# Patient Record
Sex: Female | Born: 1935 | Race: Black or African American | Hispanic: No | Marital: Single | Smoking: Never smoker
Health system: Southern US, Community
[De-identification: ages and names within clinical notes are randomized; demographics above are authoritative.]

## PROBLEM LIST (undated history)

## (undated) DIAGNOSIS — H547 Unspecified visual loss: Secondary | ICD-10-CM

## (undated) DIAGNOSIS — I10 Essential (primary) hypertension: Secondary | ICD-10-CM

## (undated) DIAGNOSIS — E785 Hyperlipidemia, unspecified: Secondary | ICD-10-CM

## (undated) DIAGNOSIS — N289 Disorder of kidney and ureter, unspecified: Secondary | ICD-10-CM

## (undated) DIAGNOSIS — IMO0002 Reserved for concepts with insufficient information to code with codable children: Secondary | ICD-10-CM

## (undated) HISTORY — DX: Hyperlipidemia, unspecified: E78.5

---
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
## 2011-11-11 ENCOUNTER — Observation Stay (HOSPITAL_COMMUNITY)
Admission: EM | Admit: 2011-11-11 | Discharge: 2011-11-12 | Disposition: A | Discharge status: Home / Self Care | Payer: Medicare Other | Attending: Internal Medicine | Admitting: Internal Medicine

## 2011-11-11 ENCOUNTER — Inpatient Hospital Stay (HOSPITAL_COMMUNITY): Payer: Medicare Other

## 2011-11-11 ENCOUNTER — Emergency Department (HOSPITAL_COMMUNITY): Payer: Medicare Other

## 2011-11-11 ENCOUNTER — Encounter (HOSPITAL_COMMUNITY): Payer: Self-pay | Admitting: *Deleted

## 2011-11-11 DIAGNOSIS — R55 Syncope and collapse: Principal | ICD-10-CM | POA: Insufficient documentation

## 2011-11-11 DIAGNOSIS — R609 Edema, unspecified: Secondary | ICD-10-CM | POA: Insufficient documentation

## 2011-11-11 DIAGNOSIS — R61 Generalized hyperhidrosis: Secondary | ICD-10-CM | POA: Insufficient documentation

## 2011-11-11 DIAGNOSIS — R404 Transient alteration of awareness: Secondary | ICD-10-CM

## 2011-11-11 DIAGNOSIS — R0989 Other specified symptoms and signs involving the circulatory and respiratory systems: Secondary | ICD-10-CM | POA: Insufficient documentation

## 2011-11-11 DIAGNOSIS — M79609 Pain in unspecified limb: Secondary | ICD-10-CM

## 2011-11-11 DIAGNOSIS — R4020 Unspecified coma: Secondary | ICD-10-CM

## 2011-11-11 DIAGNOSIS — R0609 Other forms of dyspnea: Secondary | ICD-10-CM | POA: Insufficient documentation

## 2011-11-11 DIAGNOSIS — I519 Heart disease, unspecified: Secondary | ICD-10-CM | POA: Insufficient documentation

## 2011-11-11 DIAGNOSIS — E119 Type 2 diabetes mellitus without complications: Secondary | ICD-10-CM | POA: Insufficient documentation

## 2011-11-11 DIAGNOSIS — E139 Other specified diabetes mellitus without complications: Secondary | ICD-10-CM

## 2011-11-11 DIAGNOSIS — H547 Unspecified visual loss: Secondary | ICD-10-CM | POA: Insufficient documentation

## 2011-11-11 DIAGNOSIS — M7989 Other specified soft tissue disorders: Secondary | ICD-10-CM

## 2011-11-11 DIAGNOSIS — I10 Essential (primary) hypertension: Secondary | ICD-10-CM | POA: Insufficient documentation

## 2011-11-11 HISTORY — DX: Disorder of kidney and ureter, unspecified: N28.9

## 2011-11-11 HISTORY — DX: Essential (primary) hypertension: I10

## 2011-11-11 HISTORY — DX: Unspecified visual loss: H54.7

## 2011-11-11 HISTORY — DX: Reserved for concepts with insufficient information to code with codable children: IMO0002

## 2011-11-11 LAB — CARDIAC PANEL(CRET KIN+CKTOT+MB+TROPI)
CK, MB: 2.4 ng/mL (ref 0.3–4.0)
Troponin I: <0.3 ng/mL (ref <0.30)

## 2011-11-11 LAB — CBC WITH DIFFERENTIAL/PLATELET
Basophils Absolute: 0 10*3/uL (ref 0.0–0.1)
Basophils Relative: 0 % (ref 0–1)
Eosinophils Relative: 1 % (ref 0–5)
HCT: 36.8 % (ref 36.0–46.0)
Lymphocytes Relative: 24 % (ref 12–46)
MCHC: 32.9 g/dL (ref 30.0–36.0)
MCV: 93.4 fL (ref 78.0–100.0)
Monocytes Absolute: 0.4 10*3/uL (ref 0.1–1.0)
Neutro Abs: 5.4 10*3/uL (ref 1.7–7.7)
Platelets: 233 10*3/uL (ref 150–400)
RDW: 14 % (ref 11.5–15.5)
WBC: 7.8 10*3/uL (ref 4.0–10.5)

## 2011-11-11 LAB — GLUCOSE, CAPILLARY

## 2011-11-11 LAB — POCT I-STAT TROPONIN I

## 2011-11-11 LAB — CBC
Hemoglobin: 11.3 g/dL — ABNORMAL LOW (ref 12.0–15.0)
MCH: 31.4 pg (ref 26.0–34.0)
MCHC: 33.4 g/dL (ref 30.0–36.0)
Platelets: 218 10*3/uL (ref 150–400)
RBC: 3.6 MIL/uL — ABNORMAL LOW (ref 3.87–5.11)

## 2011-11-11 LAB — URINALYSIS, ROUTINE W REFLEX MICROSCOPIC
Bilirubin Urine: NEGATIVE
Leukocytes, UA: NEGATIVE
Nitrite: NEGATIVE
Specific Gravity, Urine: 1.014 (ref 1.005–1.030)
Urobilinogen, UA: 1 mg/dL (ref 0.0–1.0)
pH: 7 (ref 5.0–8.0)

## 2011-11-11 LAB — BASIC METABOLIC PANEL
CO2: 24 mEq/L (ref 19–32)
Calcium: 8.9 mg/dL (ref 8.4–10.5)
Creatinine, Ser: 1.02 mg/dL (ref 0.50–1.10)
GFR calc Af Amer: 54 mL/min — ABNORMAL LOW (ref >90)
Sodium: 139 mEq/L (ref 135–145)

## 2011-11-11 LAB — MAGNESIUM: Magnesium: 2.1 mg/dL (ref 1.5–2.5)

## 2011-11-11 LAB — D-DIMER, QUANTITATIVE: D-Dimer, Quant: 1.26 ug/mL-FEU — ABNORMAL HIGH (ref 0.00–0.48)

## 2011-11-11 LAB — PRO B NATRIURETIC PEPTIDE
Pro B Natriuretic peptide (BNP): 136.9 pg/mL (ref 0–450)
Pro B Natriuretic peptide (BNP): 137.2 pg/mL (ref 0–450)

## 2011-11-11 MED ORDER — LEVALBUTEROL HCL 0.63 MG/3ML IN NEBU
0.6300 mg | INHALATION_SOLUTION | Freq: Four times a day (QID) | RESPIRATORY_TRACT | Status: DC
  Filled 2011-11-11 (×3): qty 3

## 2011-11-11 MED ORDER — SODIUM CHLORIDE 0.9 % IJ SOLN
3.0000 mL | Freq: Two times a day (BID) | INTRAMUSCULAR | Status: DC
  Administered 2011-11-12: 3 mL via INTRAVENOUS

## 2011-11-11 MED ORDER — LOSARTAN POTASSIUM 50 MG PO TABS
100.0000 mg | ORAL_TABLET | Freq: Every day | ORAL | Status: DC
  Administered 2011-11-12: 100 mg via ORAL
  Filled 2011-11-11: qty 2

## 2011-11-11 MED ORDER — HYDROCHLOROTHIAZIDE 25 MG PO TABS
25.0000 mg | ORAL_TABLET | Freq: Every day | ORAL | Status: DC
  Administered 2011-11-12: 25 mg via ORAL
  Filled 2011-11-11: qty 1

## 2011-11-11 MED ORDER — SODIUM CHLORIDE 0.9 % IJ SOLN
3.0000 mL | Freq: Two times a day (BID) | INTRAMUSCULAR | Status: DC
  Administered 2011-11-11: 3 mL via INTRAVENOUS

## 2011-11-11 MED ORDER — INSULIN ASPART 100 UNIT/ML ~~LOC~~ SOLN
0.0000 [IU] | Freq: Three times a day (TID) | SUBCUTANEOUS | Status: DC
  Administered 2011-11-12: 3 [IU] via SUBCUTANEOUS
  Administered 2011-11-12: 2 [IU] via SUBCUTANEOUS
  Administered 2011-11-12: 3 [IU] via SUBCUTANEOUS

## 2011-11-11 MED ORDER — AMLODIPINE BESYLATE 5 MG PO TABS
5.0000 mg | ORAL_TABLET | Freq: Every day | ORAL | Status: DC
  Administered 2011-11-12: 5 mg via ORAL
  Filled 2011-11-11: qty 1

## 2011-11-11 MED ORDER — LOSARTAN POTASSIUM-HCTZ 100-25 MG PO TABS: 1.0000 | ORAL_TABLET | Freq: Every day | ORAL | Status: DC

## 2011-11-11 MED ORDER — ENOXAPARIN SODIUM 40 MG/0.4ML ~~LOC~~ SOLN
40.0000 mg | SUBCUTANEOUS | Status: DC
  Administered 2011-11-11: 40 mg via SUBCUTANEOUS
  Filled 2011-11-11: qty 0.4

## 2011-11-11 MED ORDER — ASPIRIN EC 325 MG PO TBEC
325.0000 mg | DELAYED_RELEASE_TABLET | Freq: Every day | ORAL | Status: DC
  Administered 2011-11-12: 325 mg via ORAL
  Filled 2011-11-11: qty 1

## 2011-11-11 MED ORDER — METOPROLOL SUCCINATE ER 25 MG PO TB24
25.0000 mg | ORAL_TABLET | Freq: Every day | ORAL | Status: DC
  Administered 2011-11-12: 25 mg via ORAL
  Filled 2011-11-11: qty 1

## 2011-11-11 MED ORDER — DOCUSATE SODIUM 100 MG PO CAPS
100.0000 mg | ORAL_CAPSULE | Freq: Two times a day (BID) | ORAL | Status: DC
  Administered 2011-11-12: 100 mg via ORAL
  Filled 2011-11-11 (×2): qty 1

## 2011-11-11 MED ORDER — ONDANSETRON HCL 4 MG/2ML IJ SOLN: 4.0000 mg | Freq: Four times a day (QID) | INTRAMUSCULAR | Status: DC | PRN

## 2011-11-11 MED ORDER — SODIUM CHLORIDE 0.9 % IJ SOLN: 3.0000 mL | INTRAMUSCULAR | Status: DC | PRN

## 2011-11-11 MED ORDER — SIMVASTATIN 20 MG PO TABS
20.0000 mg | ORAL_TABLET | Freq: Every evening | ORAL | Status: DC
  Administered 2011-11-11 – 2011-11-12 (×2): 20 mg via ORAL
  Filled 2011-11-11 (×2): qty 1

## 2011-11-11 MED ORDER — ONDANSETRON HCL 4 MG/2ML IJ SOLN: 4.0000 mg | Freq: Three times a day (TID) | INTRAMUSCULAR | Status: DC | PRN

## 2011-11-11 MED ORDER — LEVALBUTEROL HCL 0.63 MG/3ML IN NEBU
0.6300 mg | INHALATION_SOLUTION | Freq: Four times a day (QID) | RESPIRATORY_TRACT | Status: DC | PRN
  Filled 2011-11-11: qty 3

## 2011-11-11 MED ORDER — ONDANSETRON HCL 4 MG PO TABS: 4.0000 mg | ORAL_TABLET | Freq: Four times a day (QID) | ORAL | Status: DC | PRN

## 2011-11-11 MED ORDER — ACETAMINOPHEN 325 MG PO TABS: 650.0000 mg | ORAL_TABLET | Freq: Four times a day (QID) | ORAL | Status: DC | PRN

## 2011-11-11 MED ORDER — ACETAMINOPHEN 650 MG RE SUPP: 650.0000 mg | Freq: Four times a day (QID) | RECTAL | Status: DC | PRN

## 2011-11-11 MED ORDER — SODIUM CHLORIDE 0.9 % IV SOLN: 250.0000 mL | INTRAVENOUS | Status: DC | PRN

## 2011-11-11 MED ORDER — SENNA 8.6 MG PO TABS
1.0000 | ORAL_TABLET | Freq: Two times a day (BID) | ORAL | Status: DC
  Administered 2011-11-11 – 2011-11-12 (×2): 8.6 mg via ORAL
  Filled 2011-11-11 (×3): qty 1

## 2011-11-11 NOTE — Progress Notes (Signed)
Right lower extremity venous duplex completed.  Preliminary report is negative for DVT, SVT, or a Baker's cyst in the right leg.  Negative for DVT in the left common femoral vein. 

## 2011-11-11 NOTE — ED Notes (Signed)
[facility]- pt is from assisted living facility. Pt was seen by other pts to go unresponsive. Pt states that she could hear everything but that they sounded far away. Residents say that they could not get her to respond to them for approx 10 minutes. No fall or injuries. Pt denies any chest pain/sob. Pt is jehovah's witness and does not like to have blood drawn. BP 160/90. Pt is blind.

## 2011-11-11 NOTE — H&P (Addendum)
Carol Poole MRN: [id number] DOB/AGE: 11-24-1917 78 y.o. Primary Care Physician:No primary provider on file. Admit date: 11/11/2011 Chief Complaint: Syncope  HPI: 78 year old female here today after a questionable syncopal episode while sitting outside in the sun. States that she sits in the sun 30 minutes a day because her vitamin D is low. Pt became light headed sitting in the sun and put a piece of candy in her mouth because she thought that she was hypoglycemic. Bystanders state that she was unresponsive for 5-10 minutes sitting up, she did not fall or hit her head. She did not have any slurred speech, unilateral weakness, dysphagia, facial droop. Patient states that she could hear the voices but could not respond. + diaphoresis. Denies chest pain shortness of breath fever nausea vomiting diarrhea or bleeding anywhere. Patient is blind and lives alone in an apartment. 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   Daughter states that a couple weeks ago her hypertension was out of control and the home health nurse had to come and check on her daily and her systolic was in the 200 range, she was recently started on Lasix by her primary care provider. She'll also try to loose weight and has lost about 10 pounds. Her blood pressure has since improved..  No medical records in the Epic system. Patient goes to Dr. Adria Dill family and then Dr. Jiles Garter with [facility] cardiology. We will have records faxed to our facility from Dr. Paulino Rily office now. PMH hypertension, diabetes, blind and renal insuff.   Past Medical History  Diagnosis Date  . Hypertension   . Diabetes mellitus   . Degeneration, intervertebral disc   . Blind   . Renal disorder     Past Surgical History  Procedure Date  . Abdominal hysterectomy     Prior to Admission medications   Medication Sig Start Date End Date Taking? Authorizing Provider  amLODipine (NORVASC) 5 MG tablet Take 5 mg by mouth  daily.    Historical Provider, MD  aspirin EC 81 MG tablet Take 81 mg by mouth daily.    Historical Provider, MD  celecoxib (CELEBREX) 100 MG capsule Take 100 mg by mouth 2 (two) times daily.    Historical Provider, MD  losartan-hydrochlorothiazide (HYZAAR) 100-25 MG per tablet Take 1 tablet by mouth daily.    Historical Provider, MD  metoprolol succinate (TOPROL-XL) 100 MG 24 hr tablet Take 100 mg by mouth daily. Take with or immediately following a meal.    Historical Provider, MD  polyethylene glycol (MIRALAX / GLYCOLAX) packet Take 17 g by mouth as needed. For constipation    Historical Provider, MD  simvastatin (ZOCOR) 20 MG tablet Take 20 mg by mouth every evening.    Historical Provider, MD    Allergies:  Allergies  Allergen Reactions  . Corticosteroids Other (See Comments)    Hearing loss.     Family history Negative for hypertension and diabetes   Social History:  reports that she has never smoked. She does not have any smokeless tobacco history on file. She reports that she does not drink alcohol or use illicit drugs.     ROS: Constitutional: Negative. Negative for fever and fatigue.  HENT: Negative. Negative for congestion.  Eyes: Negative.  Respiratory: Negative. Negative for cough, chest tightness, shortness of breath and wheezing.  Cardiovascular: Positive for syncope. Negative for chest pain and leg swelling.  Chronic ankle swelling  Gastrointestinal: Negative. Negative for nausea, vomiting and abdominal  pain.  Musculoskeletal: Negative for back pain and gait problem.  Skin: Negative for rash.  Neurological: Positive for light-headedness. Negative for dizziness, weakness, numbness and headaches.  Psychiatric/Behavioral: Negative. Negative for confusion.  All other systems reviewed and are negative.     PHYSICAL EXAM: Blood pressure 142/66, pulse 72, temperature 97 F (36.1 C), temperature source Oral, resp. rate 14, SpO2 94.00%. Constitutional: She is  oriented to person, place, and time. She appears well-developed and well-nourished.  HENT:  Head: Normocephalic and atraumatic.  Eyes: Conjunctivae and EOM are normal. Pupils are equal, round, and reactive to light.  Neck: Normal range of motion. Neck supple.  Cardiovascular: Normal rate and normal heart sounds.  No murmur heard.  Pulmonary/Chest: Effort normal and breath sounds normal. No respiratory distress. She has no wheezes. She exhibits no tenderness.  Abdominal: Soft. Bowel sounds are normal. She exhibits no distension. There is no tenderness.  Musculoskeletal: Normal range of motion. She exhibits edema. She exhibits no tenderness.  Chronic Bilateral ankle edema no edema to her lower legs  Neurological: She is alert and oriented to person, place, and time. She has normal reflexes. No cranial nerve deficit. Coordination normal.  Skin: Skin is warm and dry.  Psychiatric: She has a normal mood and affect.    No results found for this or any previous visit (from the past 240 hour(s)).   Results for orders placed during the hospital encounter of 11/11/11 (from the past 48 hour(s))  GLUCOSE, CAPILLARY     Status: Abnormal   Collection Time   11/11/11  1:09 PM      Component Value Range Comment   Glucose-Capillary 193 (*) 70 - 99 mg/dL    Comment 1 Documented in Chart      Comment 2 Notify RN     POCT I-STAT TROPONIN I     Status: Normal   Collection Time   11/11/11  3:59 PM      Component Value Range Comment   Troponin i, poc 0.01  0.00 - 0.08 ng/mL    Comment 3            URINALYSIS, ROUTINE W REFLEX MICROSCOPIC     Status: Normal   Collection Time   11/11/11  4:40 PM      Component Value Range Comment   Color, Urine YELLOW  YELLOW    APPearance CLEAR  CLEAR    Specific Gravity, Urine 1.014  1.005 - 1.030    pH 7.0  5.0 - 8.0    Glucose, UA NEGATIVE  NEGATIVE mg/dL    Hgb urine dipstick NEGATIVE  NEGATIVE    Bilirubin Urine NEGATIVE  NEGATIVE    Ketones, ur NEGATIVE   NEGATIVE mg/dL    Protein, ur NEGATIVE  NEGATIVE mg/dL    Urobilinogen, UA 1.0  0.0 - 1.0 mg/dL    Nitrite NEGATIVE  NEGATIVE    Leukocytes, UA NEGATIVE  NEGATIVE MICROSCOPIC NOT DONE ON URINES WITH NEGATIVE PROTEIN, BLOOD, LEUKOCYTES, NITRITE, OR GLUCOSE <1000 mg/dL.  CBC WITH DIFFERENTIAL     Status: Normal   Collection Time   11/11/11  5:29 PM      Component Value Range Comment   WBC 7.8  4.0 - 10.5 K/uL    RBC 3.94  3.87 - 5.11 MIL/uL    Hemoglobin 12.1  12.0 - 15.0 g/dL    HCT 40.9  81.1 - 91.4 %    MCV 93.4  78.0 - 100.0 fL    MCH 30.7  26.0 - 34.0 pg    MCHC 32.9  30.0 - 36.0 g/dL    RDW 16.1  09.6 - 04.5 %    Platelets 233  150 - 400 K/uL    Neutrophils Relative 70  43 - 77 %    Neutro Abs 5.4  1.7 - 7.7 K/uL    Lymphocytes Relative 24  12 - 46 %    Lymphs Abs 1.8  0.7 - 4.0 K/uL    Monocytes Relative 5  3 - 12 %    Monocytes Absolute 0.4  0.1 - 1.0 K/uL    Eosinophils Relative 1  0 - 5 %    Eosinophils Absolute 0.1  0.0 - 0.7 K/uL    Basophils Relative 0  0 - 1 %    Basophils Absolute 0.0  0.0 - 0.1 K/uL   BASIC METABOLIC PANEL     Status: Abnormal   Collection Time   11/11/11  5:29 PM      Component Value Range Comment   Sodium 139  135 - 145 mEq/L    Potassium 4.1  3.5 - 5.1 mEq/L    Chloride 103  96 - 112 mEq/L    CO2 24  19 - 32 mEq/L    Glucose, Bld 162 (*) 70 - 99 mg/dL    BUN 24 (*) 6 - 23 mg/dL    Creatinine, Ser 4.09  0.50 - 1.10 mg/dL    Calcium 8.9  8.4 - 81.1 mg/dL    GFR calc non Af Amer 47 (*) >90 mL/min    GFR calc Af Amer 54 (*) >90 mL/min   PRO B NATRIURETIC PEPTIDE     Status: Normal   Collection Time   11/11/11  5:30 PM      Component Value Range Comment   Pro B Natriuretic peptide (BNP) 136.9  0 - 450 pg/mL     Dg Chest Port 1 View  11/11/2011  *RADIOLOGY REPORT*  Clinical Data: Syncope.  PORTABLE CHEST - 1 VIEW  Comparison: None.  Findings: Single view of the chest demonstrates mild enlargement of the cardiac silhouette.  The thoracic aorta  is heavily calcified. There are increased densities in superior mediastinum which could be vascular or thyroid tissue.  Mild enlargement the right hilar tissue is probably vascular in etiology.  Lungs are clear without airspace disease or edema.  Degenerative changes in the right AC joint and right shoulder.  IMPRESSION: Prominent soft tissue densities in the superior mediastinum.  This finding could be related to vascular structures or enlarged thyroid tissue.  This could be further evaluated with a chest CT with IV contrast.  Mild cardiomegaly.  Original Report Authenticated By: Richarda Overlie, M.D.    Impression:  Active Problems:  Syncope  Diabetes 1.5, managed as type 1  Hypertension peripheral edema     Plan: #1 syncope patient does have a right bundle branch block and left anterior fascicular block on her EKG, somewhat changed from the last EKG, maintained on telemetry, cycle cardiac enzymes, 2-D echo, CT of the head, will also do a carotid Doppler #2 diabetes start the patient on sliding scale insulin, check hemoglobin A1c #3 hypertension continue the patient on her antihypertensive medications, decrease the dose of metoprolol given mild bradycardia and bundle branch block on the EKG She was full code Time spent 70 minutes Daughter has been counseled about plan of care by the bedside    [facility] 11/11/2011, 7:40 PM

## 2011-11-11 NOTE — ED Provider Notes (Signed)
History     CSN: [id number]  Arrival date & time 11/11/11  1259   First MD Initiated Contact with Patient 11/11/11 1504      Chief Complaint  Patient presents with  . Loss of Consciousness    (Consider location/radiation/quality/duration/timing/severity/associated sxs/prior treatment) Patient is a 78 y.o. female presenting with syncope. The history is provided by the patient. No language interpreter was used.  Loss of Consciousness This is a new problem. The current episode started today. The problem occurs rarely. The problem has been resolved. Pertinent negatives include no abdominal pain, chest pain, congestion, coughing, fatigue, fever, headaches, nausea, numbness, rash, vomiting or weakness. Nothing aggravates the symptoms.     78 year old female here today after a questionable syncopal episode while sitting outside in the sun. States that she sits in the sun 30 minutes a day because her vitamin D is low. Pt became light headed sitting in the sun and put a piece of candy in her mouth.  Bystanders state that she was unresponsive for 5-10 minutes sitting up, she did not fall or hit her head.   Patient states that she could hear the voices but could not respond. + diaphoresis.   Denies chest pain shortness of breath fever nausea vomiting diarrhea or bleeding anywhere. Patient is blind and lives alone in an apartment. 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 Daughter states that a couple weeks ago her hypertension was out of control and the home health nurse had to come and check on her daily. Patient is a TEFL teacher witness and would not like to receive blood in the hospital. No medical records in the Epic system. Patient goes to Dr. Hazle Nordmann family practice and then Dr. Jocelyn Lamer with [facility] cardiology. We will have records faxed to our facility from Dr. Paulino Rily office now.  PMH hypertension, diabetes, blind and renal insuff.   Past Medical History  Diagnosis  Date  . Hypertension   . Diabetes mellitus   . Degeneration, intervertebral disc   . Blind   . Renal disorder     Past Surgical History  Procedure Date  . Abdominal hysterectomy     No family history on file.  History  Substance Use Topics  . Smoking status: Never Smoker   . Smokeless tobacco: Not on file  . Alcohol Use: No    OB History    Grav Para Term Preterm Abortions TAB SAB Ect Mult Living                  Review of Systems  Constitutional: Negative.  Negative for fever and fatigue.  HENT: Negative.  Negative for congestion.   Eyes: Negative.   Respiratory: Negative.  Negative for cough, chest tightness, shortness of breath and wheezing.   Cardiovascular: Positive for syncope. Negative for chest pain and leg swelling.       Chronic ankle swelling  Gastrointestinal: Negative.  Negative for nausea, vomiting and abdominal pain.  Musculoskeletal: Negative for back pain and gait problem.  Skin: Negative for rash.  Neurological: Positive for light-headedness. Negative for dizziness, weakness, numbness and headaches.  Psychiatric/Behavioral: Negative.  Negative for confusion.  All other systems reviewed and are negative.    Allergies  Corticosteroids  Home Medications  No current outpatient prescriptions on file.  BP 161/58  Pulse 62  Temp 97 F (36.1 C) (Oral)  Resp 15  SpO2 100%  Physical Exam  Nursing note and vitals reviewed. Constitutional: She is oriented  to person, place, and time. She appears well-developed and well-nourished.  HENT:  Head: Normocephalic and atraumatic.  Eyes: Conjunctivae and EOM are normal. Pupils are equal, round, and reactive to light.  Neck: Normal range of motion. Neck supple.  Cardiovascular: Normal rate and normal heart sounds.   No murmur heard. Pulmonary/Chest: Effort normal and breath sounds normal. No respiratory distress. She has no wheezes. She exhibits no tenderness.  Abdominal: Soft. Bowel sounds are normal.  She exhibits no distension. There is no tenderness.  Musculoskeletal: Normal range of motion. She exhibits edema. She exhibits no tenderness.       Chronic Bilateral ankle edema no edema to her lower legs  Neurological: She is alert and oriented to person, place, and time. She has normal reflexes. No cranial nerve deficit. Coordination normal.  Skin: Skin is warm and dry.  Psychiatric: She has a normal mood and affect.    ED Course  Procedures (including critical care time)  Labs Reviewed  GLUCOSE, CAPILLARY - Abnormal; Notable for the following:    Glucose-Capillary 193 (*)     All other components within normal limits  CBC WITH DIFFERENTIAL  URINALYSIS, ROUTINE W REFLEX MICROSCOPIC  BASIC METABOLIC PANEL   No results found.   No diagnosis found.    MDM  Patient will be admitted to T 6 hospitalists for LOC.  EKG unchanged.  - trop.  CT head pending.  Labs unremarkable.      Labs Reviewed  GLUCOSE, CAPILLARY - Abnormal; Notable for the following:    Glucose-Capillary 193 (*)     All other components within normal limits  BASIC METABOLIC PANEL - Abnormal; Notable for the following:    Glucose, Bld 162 (*)     BUN 24 (*)     GFR calc non Af Amer 47 (*)     GFR calc Af Amer 54 (*)     All other components within normal limits  URINALYSIS, ROUTINE W REFLEX MICROSCOPIC  POCT I-STAT TROPONIN I  CBC WITH DIFFERENTIAL  PRO B NATRIURETIC PEPTIDE  CBC WITH DIFFERENTIAL  BASIC METABOLIC PANEL  PRO B NATRIURETIC PEPTIDE  D-DIMER, QUANTITATIVE      Date: 11/11/2011  Rate: 63  Rhythm: normal sinus rhythm  QRS Axis: normal  Intervals: normal  ST/T Wave abnormalities: normal  Conduction Disutrbances:first-degree A-V block  and right bundle branch block  Narrative Interpretation:   Old EKG Reviewed: no change         Remi Haggard, NP 11/11/11 2012

## 2011-11-12 ENCOUNTER — Observation Stay (HOSPITAL_COMMUNITY): Payer: Medicare Other

## 2011-11-12 DIAGNOSIS — R55 Syncope and collapse: Secondary | ICD-10-CM

## 2011-11-12 LAB — CARDIAC PANEL(CRET KIN+CKTOT+MB+TROPI)
CK, MB: 2.1 ng/mL (ref 0.3–4.0)
CK, MB: 2.4 ng/mL (ref 0.3–4.0)
Troponin I: <0.3 ng/mL (ref <0.30)

## 2011-11-12 LAB — GLUCOSE, CAPILLARY
Glucose-Capillary: 146 mg/dL — ABNORMAL HIGH (ref 70–99)
Glucose-Capillary: 169 mg/dL — ABNORMAL HIGH (ref 70–99)
Glucose-Capillary: 154 mg/dL — ABNORMAL HIGH (ref 70–99)

## 2011-11-12 LAB — TSH: TSH: 1.108 u[IU]/mL (ref 0.350–4.500)

## 2011-11-12 LAB — HEMOGLOBIN A1C
Hgb A1c MFr Bld: 8 % — ABNORMAL HIGH (ref <5.7)
Mean Plasma Glucose: 183 mg/dL — ABNORMAL HIGH (ref <117)

## 2011-11-12 MED ORDER — TECHNETIUM TO 99M ALBUMIN AGGREGATED
3.0000 | Freq: Once | INTRAVENOUS | Status: AC | PRN
  Administered 2011-11-12: 3 via INTRAVENOUS

## 2011-11-12 MED ORDER — XENON XE 133 GAS
18.0000 | GAS_FOR_INHALATION | Freq: Once | RESPIRATORY_TRACT | Status: AC | PRN
  Administered 2011-11-12: 18 via RESPIRATORY_TRACT

## 2011-11-12 MED ORDER — METOPROLOL SUCCINATE ER 25 MG PO TB24: 25.0000 mg | ORAL_TABLET | Freq: Every day | ORAL | Status: DC

## 2011-11-12 NOTE — Progress Notes (Addendum)
Bilateral:  No evidence of hemodynamically significant internal carotid artery stenosis.   Right vertebral artery is antegrade.  Left vertebral artery flow is retrograde.

## 2011-11-12 NOTE — Discharge Summary (Addendum)
Physician Discharge Summary  Ninamarie Keel MRN: [id number] DOB/AGE: October 10, 1917 78 y.o.  PCP: No primary provider on file.   Admit date: 11/11/2011 Discharge date: 11/12/2011  Discharge Diagnoses:   Active Problems:  Syncope , most likely vasovagal  Diabetes 1.5, managed as type 1  Hypertension   Medication List  As of 11/12/2011  8:44 AM   ASK your doctor about these medications         amLODipine 5 MG tablet   Commonly known as: NORVASC   Take 5 mg by mouth daily.      aspirin EC 81 MG tablet   Take 81 mg by mouth daily.      celecoxib 100 MG capsule   Commonly known as: CELEBREX   Take 100 mg by mouth 2 (two) times daily.      losartan-hydrochlorothiazide 100-25 MG per tablet   Commonly known as: HYZAAR   Take 1 tablet by mouth daily.      metoprolol succinate 25 MG 24 hr tablet   Commonly known as: TOPROL-XL   Take 100 mg by mouth daily. Take with or immediately following a meal.      polyethylene glycol packet   Commonly known as: MIRALAX / GLYCOLAX   Take 17 g by mouth as needed. For constipation      simvastatin 20 MG tablet   Commonly known as: ZOCOR   Take 20 mg by mouth every evening.            Discharge Condition: Stable   Disposition: Final discharge disposition not confirmed   Consults:  #1 physical and occupational therapy   Significant Diagnostic Studies: Ct Head Wo Contrast  11/11/2011  *RADIOLOGY REPORT*  Clinical Data: Loss of consciousness.  CT HEAD WITHOUT CONTRAST  Technique:  Contiguous axial images were obtained from the base of the skull through the vertex without contrast.  Comparison: No priors.  Findings: The mild cerebral and cerebellar atrophy is age appropriate.  The tiny focus of high attenuation in the left basal ganglia is most compatible with physiologic calcification.  No acute intracranial abnormality.  Specifically, no definite area of acute/subacute cerebral ischemia, no acute intracerebral hemorrhage, no focal  mass, mass effect, hydrocephalus or abnormal intra or extra-axial fluid collections.  Visualized paranasal sinuses and mastoids are well pneumatized.  No acute displaced skull fractures are identified.  IMPRESSION: 1.  No acute intracranial abnormalities. 2.  Mild cerebral and cerebellar atrophy is age appropriate.  Original Report Authenticated By: Florencia Reasons, M.D.   Dg Chest Port 1 View  11/11/2011  *RADIOLOGY REPORT*  Clinical Data: Syncope.  PORTABLE CHEST - 1 VIEW  Comparison: None.  Findings: Single view of the chest demonstrates mild enlargement of the cardiac silhouette.  The thoracic aorta is heavily calcified. There are increased densities in superior mediastinum which could be vascular or thyroid tissue.  Mild enlargement the right hilar tissue is probably vascular in etiology.  Lungs are clear without airspace disease or edema.  Degenerative changes in the right AC joint and right shoulder.  IMPRESSION: Prominent soft tissue densities in the superior mediastinum.  This finding could be related to vascular structures or enlarged thyroid tissue.  This could be further evaluated with a chest CT with IV contrast.  Mild cardiomegaly.  Original Report Authenticated By: Richarda Overlie, M.D.     2-D echo pending at this time    Microbiology: No results found for this or any previous visit (from the past 240 hour(s)).  Labs: Results for orders placed during the hospital encounter of 11/11/11 (from the past 48 hour(s))  GLUCOSE, CAPILLARY     Status: Abnormal   Collection Time   11/11/11  1:09 PM      Component Value Range Comment   Glucose-Capillary 193 (*) 70 - 99 mg/dL    Comment 1 Documented in Chart      Comment 2 Notify RN     POCT I-STAT TROPONIN I     Status: Normal   Collection Time   11/11/11  3:59 PM      Component Value Range Comment   Troponin i, poc 0.01  0.00 - 0.08 ng/mL    Comment 3            URINALYSIS, ROUTINE W REFLEX MICROSCOPIC     Status: Normal   Collection  Time   11/11/11  4:40 PM      Component Value Range Comment   Color, Urine YELLOW  YELLOW    APPearance CLEAR  CLEAR    Specific Gravity, Urine 1.014  1.005 - 1.030    pH 7.0  5.0 - 8.0    Glucose, UA NEGATIVE  NEGATIVE mg/dL    Hgb urine dipstick NEGATIVE  NEGATIVE    Bilirubin Urine NEGATIVE  NEGATIVE    Ketones, ur NEGATIVE  NEGATIVE mg/dL    Protein, ur NEGATIVE  NEGATIVE mg/dL    Urobilinogen, UA 1.0  0.0 - 1.0 mg/dL    Nitrite NEGATIVE  NEGATIVE    Leukocytes, UA NEGATIVE  NEGATIVE MICROSCOPIC NOT DONE ON URINES WITH NEGATIVE PROTEIN, BLOOD, LEUKOCYTES, NITRITE, OR GLUCOSE <1000 mg/dL.  CBC WITH DIFFERENTIAL     Status: Normal   Collection Time   11/11/11  5:29 PM      Component Value Range Comment   WBC 7.8  4.0 - 10.5 K/uL    RBC 3.94  3.87 - 5.11 MIL/uL    Hemoglobin 12.1  12.0 - 15.0 g/dL    HCT 16.1  09.6 - 04.5 %    MCV 93.4  78.0 - 100.0 fL    MCH 30.7  26.0 - 34.0 pg    MCHC 32.9  30.0 - 36.0 g/dL    RDW 40.9  81.1 - 91.4 %    Platelets 233  150 - 400 K/uL    Neutrophils Relative 70  43 - 77 %    Neutro Abs 5.4  1.7 - 7.7 K/uL    Lymphocytes Relative 24  12 - 46 %    Lymphs Abs 1.8  0.7 - 4.0 K/uL    Monocytes Relative 5  3 - 12 %    Monocytes Absolute 0.4  0.1 - 1.0 K/uL    Eosinophils Relative 1  0 - 5 %    Eosinophils Absolute 0.1  0.0 - 0.7 K/uL    Basophils Relative 0  0 - 1 %    Basophils Absolute 0.0  0.0 - 0.1 K/uL   BASIC METABOLIC PANEL     Status: Abnormal   Collection Time   11/11/11  5:29 PM      Component Value Range Comment   Sodium 139  135 - 145 mEq/L    Potassium 4.1  3.5 - 5.1 mEq/L    Chloride 103  96 - 112 mEq/L    CO2 24  19 - 32 mEq/L    Glucose, Bld 162 (*) 70 - 99 mg/dL    BUN 24 (*) 6 - 23 mg/dL  Creatinine, Ser 1.02  0.50 - 1.10 mg/dL    Calcium 8.9  8.4 - 16.1 mg/dL    GFR calc non Af Amer 47 (*) >90 mL/min    GFR calc Af Amer 54 (*) >90 mL/min   PRO B NATRIURETIC PEPTIDE     Status: Normal   Collection Time   11/11/11   5:30 PM      Component Value Range Comment   Pro B Natriuretic peptide (BNP) 136.9  0 - 450 pg/mL   D-DIMER, QUANTITATIVE     Status: Abnormal   Collection Time   11/11/11  7:44 PM      Component Value Range Comment   D-Dimer, Quant 1.26 (*) 0.00 - 0.48 ug/mL-FEU   CBC     Status: Abnormal   Collection Time   11/11/11  8:34 PM      Component Value Range Comment   WBC 6.9  4.0 - 10.5 K/uL    RBC 3.60 (*) 3.87 - 5.11 MIL/uL    Hemoglobin 11.3 (*) 12.0 - 15.0 g/dL    HCT 09.6 (*) 04.5 - 46.0 %    MCV 93.9  78.0 - 100.0 fL    MCH 31.4  26.0 - 34.0 pg    MCHC 33.4  30.0 - 36.0 g/dL    RDW 40.9  81.1 - 91.4 %    Platelets 218  150 - 400 K/uL   MAGNESIUM     Status: Normal   Collection Time   11/11/11  8:34 PM      Component Value Range Comment   Magnesium 2.1  1.5 - 2.5 mg/dL   TSH     Status: Normal   Collection Time   11/11/11  8:34 PM      Component Value Range Comment   TSH 1.108  0.350 - 4.500 uIU/mL   CARDIAC PANEL(CRET KIN+CKTOT+MB+TROPI)     Status: Normal   Collection Time   11/11/11  8:34 PM      Component Value Range Comment   Total CK 69  7 - 177 U/L    CK, MB 2.4  0.3 - 4.0 ng/mL    Troponin I <0.30  <0.30 ng/mL    Relative Index RELATIVE INDEX IS INVALID  0.0 - 2.5   HEMOGLOBIN A1C     Status: Abnormal   Collection Time   11/11/11  8:34 PM      Component Value Range Comment   Hemoglobin A1C 8.0 (*) <5.7 %    Mean Plasma Glucose 183 (*) <117 mg/dL   PRO B NATRIURETIC PEPTIDE     Status: Normal   Collection Time   11/11/11  8:34 PM      Component Value Range Comment   Pro B Natriuretic peptide (BNP) 137.2  0 - 450 pg/mL   GLUCOSE, CAPILLARY     Status: Abnormal   Collection Time   11/11/11  9:37 PM      Component Value Range Comment   Glucose-Capillary 227 (*) 70 - 99 mg/dL   CARDIAC PANEL(CRET KIN+CKTOT+MB+TROPI)     Status: Normal   Collection Time   11/12/11  4:40 AM      Component Value Range Comment   Total CK 57  7 - 177 U/L    CK, MB 2.1  0.3 - 4.0 ng/mL     Troponin I <0.30  <0.30 ng/mL    Relative Index RELATIVE INDEX IS INVALID  0.0 - 2.5   GLUCOSE, CAPILLARY  Status: Abnormal   Collection Time   11/12/11  6:18 AM      Component Value Range Comment   Glucose-Capillary 146 (*) 70 - 99 mg/dL      Carol Poole 78 year old female here today after a questionable syncopal episode while sitting outside in the sun. Pt became light headed sitting in the sun and put a piece of candy in her mouth because she thought that she was hypoglycemic. Bystanders state that she was unresponsive for 5-10 minutes sitting up, she did not fall or hit her head. She did not have any slurred speech, unilateral weakness, dysphagia, facial droop.  Patient states that she could hear the voices but could not respond. + diaphoresis. Denies chest pain shortness of breath fever nausea vomiting diarrhea or bleeding anywhere. Patient is blind and lives alone in an apartment.  Daughter states that a couple weeks ago her hypertension was out of control and the home health nurse had to come and check on her daily and her systolic was in the 200 range, she was recently started on Lasix by her primary care provider. She'll also try to loose weight and has lost about 10 pounds. Her blood pressure has since improved..  No medical records in the Epic system. Patient goes to Dr. Adria Dill family and Dr. Jiles Garter with [facility] cardiology saw him as recently as 2 months ago.     HOSPITAL COURSE:  #1 syncope patient was found to have a right bundle branch block and left anterior vascular block echocardiogram cycled and found to be negative 2-D echo showed diastolic heart failure .chronic with a normal BNP  VQ scan chest was done due to abnormal  d-dimer  Bilateral Doppler lower extremities was negative for DVT Physical and occupational therapy consultation has been obtained The patient's CT and is negative and 2-D echo is within normal limits the patient will be discharged home today Doubt  that the patient had a seizure episode. No history of seizures. CT scan was negative Patient recommended to continue aspirin 81 mg daily  #2 diabetes the patient's hemoglobin A1c is 8.0 continue outpatient regimen, without adjustment given her upon stage  #3 hypertension metoprolol was decreased to 25 mg by mouth daily given her bradycardia   Discharge Exam:  Blood pressure 105/65, pulse 74, temperature 98.6 F (37 C), temperature source Oral, resp. rate 18, SpO2 96.00%.  Constitutional: She is oriented to person, place, and time. She appears well-developed and well-nourished.  HENT:  Head: Normocephalic and atraumatic.  Eyes: Conjunctivae and EOM are normal. Pupils are equal, round, and reactive to light.  Neck: Normal range of motion. Neck supple.  Cardiovascular: Normal rate and normal heart sounds.  No murmur heard.  Pulmonary/Chest: Effort normal and breath sounds normal. No respiratory distress. She has no wheezes. She exhibits no tenderness.  Abdominal: Soft. Bowel sounds are normal. She exhibits no distension. There is no tenderness.  Musculoskeletal: Normal range of motion. She exhibits edema. She exhibits no tenderness.  Chronic Bilateral ankle edema no edema to her lower legs  Neurological: She is alert and oriented to person, place, and time. She has normal reflexes. No cranial nerve deficit. Coordination normal.  Skin: Skin is warm and dry.  Psychiatric: She has a normal mood and affect.         SignedRicharda Overlie 11/12/2011, 8:44 AM

## 2011-11-12 NOTE — Care Management Note (Signed)
    Page 1 of 1   11/12/2011     9:42:37 AM   CARE MANAGEMENT NOTE 11/12/2011  Patient:  Carol Poole, Carol Poole   Account Number:  [id number]  Date Initiated:  11/12/2011  Documentation initiated by:  Avie Arenas  Subjective/Objective Assessment:   Syncopal episode.  Lives at [place]     Action/Plan:   Anticipated DC Date:  11/13/2011   Anticipated DC Plan:  HOME/SELF CARE      DC Planning Services  CM consult      Choice offered to / List presented to:             Status of service:  In process, will continue to follow Medicare Important Message given?   (If response is "NO", the following Medicare IM given date fields will be blank) Date Medicare IM given:   Date Additional Medicare IM given:    Discharge Disposition:    Per UR Regulation:  Reviewed for med. necessity/level of care/duration of stay  If discussed at Long Length of Stay Meetings, dates discussed:    Comments:  Contact:  Horton,[name] Daughter [phone]

## 2011-11-12 NOTE — Progress Notes (Signed)
Ambulated pt on room air, pt's sats 98% on RA. Pt denies chest pain or SOB.

## 2011-11-12 NOTE — Progress Notes (Signed)
  Echocardiogram 2D Echocardiogram has been performed.  Carol Poole 11/12/2011, 11:36 AM

## 2011-11-12 NOTE — Evaluation (Signed)
Physical Therapy Evaluation Patient Details Name: Carol Poole MRN: [id number] DOB: 03/31/1918 Today's Date: 11/12/2011 Time: 1012-1020 PT Time Calculation (min): 8 min  PT Assessment / Plan / Recommendation Clinical Impression  Pt adm with syncope of unknown origin with continued workup underway. Will need at least one more PT session for full balance assesment and to determine any d/c needs. (Session interrupted for pt to prepare to go to cath lab)    PT Assessment  Patient needs continued PT services    Follow Up Recommendations  Other (comment) (TBA once assessment complete)    Barriers to Discharge        Equipment Recommendations  None recommended by PT    Recommendations for Other Services     Frequency Min 3X/week    Precautions / Restrictions Precautions Precautions: Fall;Other (comment) Precaution Comments: legally blind Rt eye; completely blind Lt eye Restrictions Weight Bearing Restrictions: No   Pertinent Vitals/Pain Denied pain      Mobility  Bed Mobility Bed Mobility: Rolling Left;Left Sidelying to Sit;Sit to Supine;Scooting to [facility] Rolling Left: 7: Independent Left Sidelying to Sit: 6: Modified independent (Device/Increase time);HOB flat Sit to Supine: 6: Modified independent (Device/Increase time);HOB flat Scooting to HOB: 6: Modified independent (Device/Increase time) Details for Bed Mobility Assistance: pt requires slightly incr time with incr effort Transfers Transfers: Sit to Stand;Stand to Sit Sit to Stand: 5: Supervision;Without upper extremity assist;From bed Stand to Sit: 5: Supervision;With upper extremity assist;To bed Details for Transfer Assistance: Supervision due to pt's report of h/o imbalance, although no loss of balance Ambulation/Gait Ambulation/Gait Assistance: 4: Min guard Ambulation Distance (Feet): 20 Feet Assistive device: 1 person hand held assist Ambulation/Gait Assistance Details: Pt reaching to hold onto countertop, table  top; no loss of balance noted, yet limited due to IV team arrival and needing to start IV to take pt to cath lab for angiogram Gait Pattern: Step-through pattern;Decreased stride length;Right foot flat;Left foot flat;Shuffle;Trunk flexed    Exercises     PT Diagnosis: Difficulty walking  PT Problem List: Decreased balance;Decreased mobility;Other (comment) (decr vision) PT Treatment Interventions: Gait training;Balance training;DME instruction;Patient/family education   PT Goals Acute Rehab PT Goals PT Goal Formulation: With patient Time For Goal Achievement: 11/13/11 Potential to Achieve Goals: Good Pt will go Sit to Stand: with modified independence PT Goal: Sit to Stand - Progress: Goal set today Pt will Ambulate: 51 - 150 feet;with modified independence;with least restrictive assistive device PT Goal: Ambulate - Progress: Goal set today Additional Goals Additional Goal #1: Pt will complete further balance testing to determine fall risk. PT Goal: Additional Goal #1 - Progress: Goal set today  Visit Information  Last PT Received On: 11/12/11 Assistance Needed: +1    Subjective Data  Subjective: Reports she has an imbalance problem and uses cane vs RW all the time Patient Stated Goal: Return to her apartment   Prior Functioning  Home Living Lives With: Alone Available Help at Discharge: Family;Personal care attendant;Available PRN/intermittently Type of Home: Apartment (pt denies it is an ALF (? is per Case Manager)) Home Access: Level entry Home Layout: One level Home Adaptive Equipment: Straight cane;Walker - rolling Additional Comments: history incomplete 2/2 interupted to go to cath lab Prior Function Level of Independence: Needs assistance Needs Assistance: Meal Prep;Light Housekeeping Communication Communication: No difficulties    Cognition  Overall Cognitive Status: Difficult to assess Difficult to assess due to: Other (comment) (limited session due to going to  cath lab) Arousal/Alertness: Awake/alert Behavior During Session: [facility]  for tasks performed    Extremity/Trunk Assessment Right Lower Extremity Assessment RLE ROM/Strength/Tone: Unable to fully assess Left Lower Extremity Assessment LLE ROM/Strength/Tone: Unable to fully assess   Balance Balance Balance Assessed: Yes Static Sitting Balance Static Sitting - Balance Support: No upper extremity supported;Feet supported Static Sitting - Level of Assistance: 7: Independent Static Standing Balance Static Standing - Balance Support: No upper extremity supported Static Standing - Level of Assistance: 5: Stand by assistance Static Standing - Comment/# of Minutes: Stood x 45 seconds with no incr sway, unsteadiness  End of Session PT - End of Session Activity Tolerance: Patient tolerated treatment well Patient left: in bed;with call bell/phone within reach;with nursing in room;with bed alarm set Nurse Communication: Mobility status  GP Functional Assessment Tool Used: clinical judgment Functional Limitation: Mobility: Walking and moving around Mobility: Walking and Moving Around Current Status (A5409): At least 1 percent but less than 20 percent impaired, limited or restricted Mobility: Walking and Moving Around Goal Status [phone]): At least 1 percent but less than 20 percent impaired, limited or restricted   Ronin Rehfeldt 11/12/2011, 11:20 AM  Pager [phone]

## 2011-11-12 NOTE — ED Provider Notes (Signed)
Medical screening examination/treatment/procedure(s) were conducted as a shared visit with non-physician practitioner(s) and myself.  I personally evaluated the patient during the encounter  Derwood Kaplan, MD 11/12/11 0041

## 2012-08-18 ENCOUNTER — Emergency Department (HOSPITAL_BASED_OUTPATIENT_CLINIC_OR_DEPARTMENT_OTHER): Payer: Medicare Other

## 2012-08-18 ENCOUNTER — Encounter (HOSPITAL_BASED_OUTPATIENT_CLINIC_OR_DEPARTMENT_OTHER): Payer: Self-pay

## 2012-08-18 ENCOUNTER — Emergency Department (HOSPITAL_BASED_OUTPATIENT_CLINIC_OR_DEPARTMENT_OTHER)
Admission: EM | Admit: 2012-08-18 | Discharge: 2012-08-18 | Disposition: A | Discharge status: Home / Self Care | Payer: Medicare Other | Attending: Emergency Medicine | Admitting: Emergency Medicine

## 2012-08-18 DIAGNOSIS — M47816 Spondylosis without myelopathy or radiculopathy, lumbar region: Secondary | ICD-10-CM

## 2012-08-18 DIAGNOSIS — Z8739 Personal history of other diseases of the musculoskeletal system and connective tissue: Secondary | ICD-10-CM | POA: Insufficient documentation

## 2012-08-18 DIAGNOSIS — I10 Essential (primary) hypertension: Secondary | ICD-10-CM | POA: Insufficient documentation

## 2012-08-18 DIAGNOSIS — E119 Type 2 diabetes mellitus without complications: Secondary | ICD-10-CM | POA: Insufficient documentation

## 2012-08-18 DIAGNOSIS — Z87448 Personal history of other diseases of urinary system: Secondary | ICD-10-CM | POA: Insufficient documentation

## 2012-08-18 DIAGNOSIS — Z79899 Other long term (current) drug therapy: Secondary | ICD-10-CM | POA: Insufficient documentation

## 2012-08-18 DIAGNOSIS — M47817 Spondylosis without myelopathy or radiculopathy, lumbosacral region: Secondary | ICD-10-CM | POA: Insufficient documentation

## 2012-08-18 DIAGNOSIS — Z8669 Personal history of other diseases of the nervous system and sense organs: Secondary | ICD-10-CM | POA: Insufficient documentation

## 2012-08-18 DIAGNOSIS — Z7982 Long term (current) use of aspirin: Secondary | ICD-10-CM | POA: Insufficient documentation

## 2012-08-18 LAB — URINALYSIS, ROUTINE W REFLEX MICROSCOPIC
Ketones, ur: NEGATIVE mg/dL
Leukocytes, UA: NEGATIVE
Nitrite: NEGATIVE
Protein, ur: NEGATIVE mg/dL
Urobilinogen, UA: 1 mg/dL (ref 0.0–1.0)

## 2012-08-18 MED ORDER — OXYCODONE-ACETAMINOPHEN 5-325 MG PO TABS: 0.5000 | ORAL_TABLET | Freq: Four times a day (QID) | ORAL | Status: DC | PRN

## 2012-08-18 NOTE — ED Notes (Signed)
Pt complains of left sided hip area that radiates to groin area that started on 08/13/2012. Pt denies urinary abnormalities. Pt denies recent fall or injury.

## 2012-08-18 NOTE — ED Provider Notes (Signed)
History     CSN: [id number]  Arrival date & time 08/18/12  1327   First MD Initiated Contact with Patient 08/18/12 1340      Chief Complaint  Patient presents with  . Hip Pain    (Consider location/radiation/quality/duration/timing/severity/associated sxs/prior treatment) Patient is a 79 y.o. female presenting with hip pain.  Hip Pain   Pt with history of back pain several months ago which had resolved now reports about 5 days of pain in L hip radiating around to her L groin. No fever, vomiting or diarrhea. No dysuria. Pt denies any recent falls, pain is not worse with leg movement. Has been taking hydrocodone with some improvement.   Past Medical History  Diagnosis Date  . Hypertension   . Diabetes mellitus   . Degeneration, intervertebral disc   . Blind   . Renal disorder     Past Surgical History  Procedure Laterality Date  . Abdominal hysterectomy      No family history on file.  History  Substance Use Topics  . Smoking status: Never Smoker   . Smokeless tobacco: Not on file  . Alcohol Use: No    OB History   Grav Para Term Preterm Abortions TAB SAB Ect Mult Living                  Review of Systems All other systems reviewed and are negative except as noted in HPI.   Allergies  Corticosteroids  Home Medications   Current Outpatient Rx  Name  Route  Sig  Dispense  Refill  . furosemide (LASIX) 20 MG tablet   Oral   Take 20 mg by mouth daily as needed.         Marland Kitchen HYDROcodone-acetaminophen (NORCO/VICODIN) 5-325 MG per tablet   Oral   Take 1 tablet by mouth every 8 (eight) hours as needed for pain.         Marland Kitchen amLODipine (NORVASC) 5 MG tablet   Oral   Take 5 mg by mouth daily.         Marland Kitchen aspirin EC 81 MG tablet   Oral   Take 81 mg by mouth daily.         . celecoxib (CELEBREX) 100 MG capsule   Oral   Take 100 mg by mouth 2 (two) times daily.         Marland Kitchen losartan-hydrochlorothiazide (HYZAAR) 100-25 MG per tablet   Oral   Take 1  tablet by mouth daily.         . metoprolol succinate (TOPROL-XL) 25 MG 24 hr tablet   Oral   Take 1 tablet (25 mg total) by mouth daily.   30 tablet   0   . polyethylene glycol (MIRALAX / GLYCOLAX) packet   Oral   Take 17 g by mouth as needed. For constipation         . simvastatin (ZOCOR) 20 MG tablet   Oral   Take 20 mg by mouth every evening.           BP 170/65  Pulse 73  Temp(Src) 98 F (36.7 C) (Oral)  Resp 16  SpO2 98%  Physical Exam  Nursing note and vitals reviewed. Constitutional: She is oriented to person, place, and time. She appears well-developed and well-nourished.  HENT:  Head: Normocephalic and atraumatic.  Eyes:  Blind in L eye  Neck: Normal range of motion. Neck supple.  Cardiovascular: Normal rate, normal heart sounds and intact distal pulses.  Pulmonary/Chest: Effort normal and breath sounds normal.  Abdominal: Bowel sounds are normal. She exhibits no distension. There is no tenderness.  Musculoskeletal: Normal range of motion. She exhibits tenderness (point tenderness over the L iliac crest, no tenderness over the hip, no pain with ROM of hip\). She exhibits no edema.  Neurological: She is alert and oriented to person, place, and time. She has normal strength. No cranial nerve deficit or sensory deficit.  Skin: Skin is warm and dry. No rash noted.  Psychiatric: She has a normal mood and affect.    ED Course  Procedures (including critical care time)  Labs Reviewed  URINALYSIS, ROUTINE W REFLEX MICROSCOPIC   Dg Lumbar Spine Complete  08/18/2012  *RADIOLOGY REPORT*  Clinical Data: Left side back and left hip pain.  LUMBAR SPINE - COMPLETE 4+ VIEW  Comparison: None.  Findings: Vertebral body alignment is maintained with straightening of the normal lumbar lordosis noted.  There is a mild, remote appearing inferior endplate compression fracture of T12.  No acute appearing fracture is seen.  The patient has severe multilevel degenerative change  with loss of disc height appearing worst at L3- 4.  The L2-3 level is autologously fused.  Multilevel facet arthropathy is noted.  IMPRESSION: No acute finding.  Severe degenerative disease.   Original Report Authenticated By: Holley Dexter, M.D.    Dg Pelvis 1-2 Views  08/18/2012  *RADIOLOGY REPORT*  Clinical Data: Left back and left hip pain.  PELVIS - 1-2 VIEW  Comparison: None.  Findings: Both hips are located.  There is no fracture.  There is some degenerative change about the hips, worse on the right.  Lower lumbar spondylosis is incompletely visualized. Extensive atherosclerotic vascular disease is identified.  IMPRESSION:  1.  No acute finding. 2.  Right worse than left hip degenerative change. 3.  Atherosclerosis.   Original Report Authenticated By: Holley Dexter, M.D.      1. Degenerative arthritis of lumbar spine       MDM  UA neg. Musculoskeletal pain, likely from degenerative disease. Advised to stop hydrocodone as it isn't working well. Short course of oxycodone with close PCP follow up for recheck. She does not have any radicular symptoms or concern for acute cord compression.         Trudee Chirino B. Bernette Mayers, MD 08/18/12 1451

## 2013-03-15 ENCOUNTER — Encounter (HOSPITAL_COMMUNITY): Payer: Self-pay | Admitting: Emergency Medicine

## 2013-03-15 ENCOUNTER — Emergency Department (HOSPITAL_COMMUNITY)
Admission: EM | Admit: 2013-03-15 | Discharge: 2013-03-15 | Disposition: A | Discharge status: Home / Self Care | Payer: Medicare Other | Attending: Emergency Medicine | Admitting: Emergency Medicine

## 2013-03-15 ENCOUNTER — Emergency Department (HOSPITAL_COMMUNITY): Payer: Medicare Other

## 2013-03-15 DIAGNOSIS — Z794 Long term (current) use of insulin: Secondary | ICD-10-CM | POA: Insufficient documentation

## 2013-03-15 DIAGNOSIS — E119 Type 2 diabetes mellitus without complications: Secondary | ICD-10-CM | POA: Insufficient documentation

## 2013-03-15 DIAGNOSIS — Z87448 Personal history of other diseases of urinary system: Secondary | ICD-10-CM | POA: Insufficient documentation

## 2013-03-15 DIAGNOSIS — Z79899 Other long term (current) drug therapy: Secondary | ICD-10-CM | POA: Insufficient documentation

## 2013-03-15 DIAGNOSIS — M549 Dorsalgia, unspecified: Secondary | ICD-10-CM | POA: Insufficient documentation

## 2013-03-15 DIAGNOSIS — H543 Unqualified visual loss, both eyes: Secondary | ICD-10-CM | POA: Insufficient documentation

## 2013-03-15 DIAGNOSIS — I10 Essential (primary) hypertension: Secondary | ICD-10-CM | POA: Insufficient documentation

## 2013-03-15 DIAGNOSIS — Z7982 Long term (current) use of aspirin: Secondary | ICD-10-CM | POA: Insufficient documentation

## 2013-03-15 LAB — URINALYSIS, ROUTINE W REFLEX MICROSCOPIC
Bilirubin Urine: NEGATIVE
Ketones, ur: NEGATIVE mg/dL
Nitrite: NEGATIVE
Urobilinogen, UA: 1 mg/dL (ref 0.0–1.0)
pH: 8 (ref 5.0–8.0)

## 2013-03-15 MED ORDER — CYCLOBENZAPRINE HCL 10 MG PO TABS
5.0000 mg | ORAL_TABLET | Freq: Once | ORAL | Status: AC
  Administered 2013-03-15: 5 mg via ORAL
  Filled 2013-03-15: qty 1

## 2013-03-15 NOTE — ED Notes (Signed)
Bed: AV40 Expected date:  Expected time:  Means of arrival:  Comments: 79 y/o F back pain

## 2013-03-15 NOTE — ED Provider Notes (Signed)
CSN: [id number]     Arrival date & time 03/15/13  0904 History   None    Chief Complaint  Patient presents with  . Back Pain   (Consider location/radiation/quality/duration/timing/severity/associated sxs/prior Treatment) HPI Comments: 79 yo female with hx of degenerative disc disease, HTN, DM presents with gradual onset of left sided back pain that started yesterday morning after getting out of bed in the morning. Reports it started out as sore, but has worsened and is now sharp. Reports no pain when lying still. Pain reproducible with position change and certain movements, improved after single dose of oxycodone. Denies numbness, tingling, weakness, abd pain, dysuria.  The history is provided by the patient.    Past Medical History  Diagnosis Date  . Hypertension   . Diabetes mellitus   . Degeneration, intervertebral disc   . Blind   . Renal disorder    Past Surgical History  Procedure Laterality Date  . Abdominal hysterectomy     No family history on file. History  Substance Use Topics  . Smoking status: Never Smoker   . Smokeless tobacco: Not on file  . Alcohol Use: No   OB History   Grav Para Term Preterm Abortions TAB SAB Ect Mult Living                 Review of Systems  Constitutional: Negative for fever.  HENT: Negative for rhinorrhea and sore throat.   Respiratory: Negative for cough.   Cardiovascular: Negative for chest pain.  Gastrointestinal: Negative for abdominal pain.  Genitourinary: Negative for dysuria.  Musculoskeletal: Positive for back pain.  Skin: Negative for rash.  Neurological: Negative for headaches.  Hematological: Negative for adenopathy.  Psychiatric/Behavioral: Negative for agitation.    Allergies  Corticosteroids  Home Medications   Current Outpatient Rx  Name  Route  Sig  Dispense  Refill  . amLODipine (NORVASC) 5 MG tablet   Oral   Take 5 mg by mouth daily.         Marland Kitchen aspirin EC 81 MG tablet   Oral   Take 81 mg by mouth  daily.         . furosemide (LASIX) 20 MG tablet   Oral   Take 20 mg by mouth daily as needed for fluid.          . Insulin Detemir (LEVEMIR FLEXPEN) 100 UNIT/ML SOPN   Subcutaneous   Inject 25 Units into the skin at bedtime.          . Insulin Isophane Human (HUMULIN N KWIKPEN) 100 UNIT/ML SUPN   Subcutaneous   Inject 10 Units into the skin at bedtime.         Marland Kitchen losartan-hydrochlorothiazide (HYZAAR) 100-25 MG per tablet   Oral   Take 1 tablet by mouth daily.         . metoprolol succinate (TOPROL-XL) 25 MG 24 hr tablet   Oral   Take 25 mg by mouth daily.         Marland Kitchen oxyCODONE-acetaminophen (PERCOCET/ROXICET) 5-325 MG per tablet   Oral   Take 0.5-1 tablets by mouth every 6 (six) hours as needed for pain.   20 tablet   0    BP 193/75  Pulse 90  Temp(Src) 98.1 F (36.7 C) (Oral)  Resp 20  SpO2 97% Physical Exam  Vitals reviewed. Constitutional: She is oriented to person, place, and time. She appears well-developed and well-nourished. No distress.  HENT:  Head: Normocephalic and atraumatic.  Right Ear:  External ear normal.  Left Ear: External ear normal.  Mouth/Throat: Oropharynx is clear and moist.  Eyes: EOM are normal.  Neck: Normal range of motion.  Musculoskeletal: She exhibits edema (mild edema bilateral feet, similar to baseline).       Lumbar back: She exhibits decreased range of motion, tenderness ( not reproducible to palpation, but reproducible with torso twist and lumbar flexion), pain and spasm. She exhibits no bony tenderness, no swelling and normal pulse.       Back:  Neurological: She is alert and oriented to person, place, and time. She has normal strength and normal reflexes. No sensory deficit.  Skin: Skin is warm and dry.  Psychiatric: She has a normal mood and affect.    ED Course  Procedures (including critical care time) Labs Review Labs Reviewed  URINALYSIS, ROUTINE W REFLEX MICROSCOPIC - Abnormal; Notable for the following:     APPearance CLOUDY (*)    All other components within normal limits   Imaging Review Dg Chest 2 View  03/15/2013   CLINICAL DATA:  Hypertension.  Left chest pain.  Diabetes.  EXAM: CHEST  2 VIEW  COMPARISON:  11/11/2011  FINDINGS: Moderate-sized hiatal hernia. Indistinct 2.4 cm density projects over the left heart border.  Atherosclerosis. Cardiothoracic index 60%. Thoracic spondylosis. Stable upper lumbar spondylosis. Airway thickening is present, suggesting bronchitis or reactive airways disease.  IMPRESSION: 1. Possible pneumonia or pulmonary nodule in the lingula, with ill-defined density in this vicinity. Followup chest radiography is recommended in 4 weeks time to ensure resolution and exclude underlying malignancy. Alternatively, chest CT could be utilized for further characterization. 2. Moderate hiatal hernia. 3. Atherosclerosis. 4. Thoracolumbar spondylosis. 5. Airway thickening is present, suggesting bronchitis or reactive airways disease.   Electronically Signed   By: Herbie Baltimore M.D.   On: 03/15/2013 10:11   Dg Lumbar Spine 2-3 Views  03/15/2013   CLINICAL DATA:  Hypertension.  Left back pain.  EXAM: LUMBAR SPINE - 2-3 VIEW  COMPARISON:  08/18/2012  FINDINGS: Prominent lumbar spondylosis and degenerative disc disease again noted. Fusion at L2-3. Prominent atherosclerosis. Severe loss of articular space at L1-2, L3-4, and L5-S1. Vacuum disc phenomenon at L1-2 and L3-4. Multilevel posterior osseous ridging. Stable anterior wedging at T12.  Chondrocalcinosis of the pubic symphysis.  IMPRESSION: 1. Stable prominent lumbar spondylosis and degenerative disc disease. No acute lumbar spine findings. 2. Atherosclerosis.   Electronically Signed   By: Herbie Baltimore M.D.   On: 03/15/2013 10:13    EKG Interpretation   None       MDM   1. Back pain    79 yo female with left sided upper lumbar pain. The pain is sharp and entirely reproducible with movement, suggestive of msk origin.  Abd  soft and nontender, peripheral pulses intact, no evidence to suggest aortic pathology. UA neg for UTI, neg for blood, doubt renal colic. Spine xray reveals degenerative disease, no evidence of acute fx, osseous lesions. Neuro exam reveals no deficits, no evidence to suggest cord compression, no further emergent spine imaging indicated. CXR reveal 2.4 density over left heart border. There is mention of possible pneumonia. However, patient without cough, fever, dyspnea nor other signs of illness, doubt pneumonia. O2 sat 97% on RA indicating excellent oxygenation. Will recommend f/u with PCP for repeat xray and further imaging if indicated. Clinical picture supportive of musculoskeletal back pain. Recommend percocet PRN, rest, f/u with PCP. Strict return instructions discussed and provided to the patient in writing at time  of d/c.     Simmie Davies, NP 03/15/13 1040

## 2013-03-15 NOTE — ED Provider Notes (Signed)
Medical screening examination/treatment/procedure(s) were performed by non-physician practitioner and as supervising physician I was immediately available for consultation/collaboration.  EKG Interpretation   None        Renne Cornick, MD 03/15/13 1738 

## 2013-03-15 NOTE — ED Notes (Signed)
[facility] with c/o of mid to lower back pain. Worse with movement. Denies injury or fall. Legally blind.

## 2013-03-15 NOTE — Progress Notes (Signed)
   CARE MANAGEMENT ED NOTE 03/15/2013  Patient:  Carol Poole, Carol Poole   Account Number:  [id number]  Date Initiated:  03/15/2013  Documentation initiated by:  Edd Arbour  Subjective/Objective Assessment:   79 yr old female [employer] aarp medicare complete/medicaid of [place] pt with 1 ED visit, no admissions in the last 6 months, [facility] c/o of mid to lower back pain. Worse with movement. Denies injury     Subjective/Objective Assessment Detail:   No pcp listed in EPIC but pt confirmed pcp as Dr Mila Palmer  pt support system dtr listed in EPIC as [name] Horton [phone]     Action/Plan:   CM spoke with pt and updated EPIC   Action/Plan Detail:   Anticipated DC Date:  03/15/2013     Status Recommendation to Physician:   Result of Recommendation:    Other ED Services  Consult Working Plan    DC Planning Services  Other  PCP issues  Outpatient Services - Pt will follow up    Choice offered to / List presented to:            Status of service:  Completed, signed off  ED Comments:   ED Comments Detail:

## 2013-03-15 NOTE — Progress Notes (Signed)
[facility] is independent Environmental manager.   Catha Gosselin, LCSW [phone]  ED CSW .03/15/2013 956am

## 2013-04-28 ENCOUNTER — Ambulatory Visit
Admission: RE | Admit: 2013-04-28 | Discharge: 2013-04-28 | Disposition: A | Discharge status: Home / Self Care | Payer: Medicare Other | Source: Ambulatory Visit | Attending: Family Medicine | Admitting: Family Medicine

## 2013-04-28 ENCOUNTER — Other Ambulatory Visit: Payer: Self-pay | Admitting: Family Medicine

## 2013-04-28 DIAGNOSIS — R911 Solitary pulmonary nodule: Secondary | ICD-10-CM

## 2013-10-30 ENCOUNTER — Encounter: Payer: Self-pay | Admitting: Podiatry

## 2013-10-30 ENCOUNTER — Ambulatory Visit (INDEPENDENT_AMBULATORY_CARE_PROVIDER_SITE_OTHER): Payer: Medicare Other | Admitting: Podiatry

## 2013-10-30 VITALS — BP 163/74 | HR 85 | Resp 18 | Ht 63.0 in | Wt 175.0 lb

## 2013-10-30 DIAGNOSIS — E1049 Type 1 diabetes mellitus with other diabetic neurological complication: Secondary | ICD-10-CM

## 2013-10-30 DIAGNOSIS — B351 Tinea unguium: Secondary | ICD-10-CM

## 2013-10-30 NOTE — Progress Notes (Signed)
   Subjective:    Patient ID: Carol Poole, female    DOB: [birth year], 80 y.o.   MRN: [id number]  HPI Comments: N debridement L 10 toenails D and O long-term C elongated toenails A diabetes and difficulty cutting T none  Patient is also requesting diabetic shoes  Review of Systems  HENT: Positive for hearing loss.   Cardiovascular: Positive for leg swelling.  Musculoskeletal: Positive for back pain.  All other systems reviewed and are negative.      Objective:   Physical Exam  Orientated x3 black female presents with caregiver  Vascular: DP and PT pulses 1/4 bilaterally Pitting edema bilaterally  Neurological: Sensation detained in monofilament wire intact 3/5 right and 5/5 left Ankle reflexes reactive bilaterally Vibratory sensation nonreactive bilaterally  Dermatological: Elongated, incurvated, discolored toenails 6-10  Musculoskeletal: HAV deformities noted bilaterally      Assessment & Plan:   Assessment: Edema bilaterally  diabetic peripheral neuropathy Onychomycoses with symptoms 6-10 HAV deformities bilaterally  Plan: Nails x10 are debrided without any bleeding  Reappoint at three-month intervals  Obtain medical clearance for diabetic shoes for the indication of Edema Diabetic peripheral neuropathy HAV deformities

## 2013-10-30 NOTE — Patient Instructions (Signed)
Patient requests diabetic shoes Diabetes and Foot Care Diabetes may cause you to have problems because of poor blood supply (circulation) to your feet and legs. This may cause the skin on your feet to become thinner, break easier, and heal more slowly. Your skin may become dry, and the skin may peel and crack. You may also have nerve damage in your legs and feet causing decreased feeling in them. You may not notice minor injuries to your feet that could lead to infections or more serious problems. Taking care of your feet is one of the most important things you can do for yourself.  HOME CARE INSTRUCTIONS  Wear shoes at all times, even in the house. Do not go barefoot. Bare feet are easily injured.  Check your feet daily for blisters, cuts, and redness. If you cannot see the bottom of your feet, use a mirror or ask someone for help.  Wash your feet with warm water (do not use hot water) and mild soap. Then pat your feet and the areas between your toes until they are completely dry. Do not soak your feet as this can dry your skin.  Apply a moisturizing lotion or petroleum jelly (that does not contain alcohol and is unscented) to the skin on your feet and to dry, brittle toenails. Do not apply lotion between your toes.  Trim your toenails straight across. Do not dig under them or around the cuticle. File the edges of your nails with an emery board or nail file.  Do not cut corns or calluses or try to remove them with medicine.  Wear clean socks or stockings every day. Make sure they are not too tight. Do not wear knee-high stockings since they may decrease blood flow to your legs.  Wear shoes that fit properly and have enough cushioning. To break in new shoes, wear them for just a few hours a day. This prevents you from injuring your feet. Always look in your shoes before you put them on to be sure there are no objects inside.  Do not cross your legs. This may decrease the blood flow to your  feet.  If you find a minor scrape, cut, or break in the skin on your feet, keep it and the skin around it clean and dry. These areas may be cleansed with mild soap and water. Do not cleanse the area with peroxide, alcohol, or iodine.  When you remove an adhesive bandage, be sure not to damage the skin around it.  If you have a wound, look at it several times a day to make sure it is healing.  Do not use heating pads or hot water bottles. They may burn your skin. If you have lost feeling in your feet or legs, you may not know it is happening until it is too late.  Make sure your health care provider performs a complete foot exam at least annually or more often if you have foot problems. Report any cuts, sores, or bruises to your health care provider immediately. SEEK MEDICAL CARE IF:   You have an injury that is not healing.  You have cuts or breaks in the skin.  You have an ingrown nail.  You notice redness on your legs or feet.  You feel burning or tingling in your legs or feet.  You have pain or cramps in your legs and feet.  Your legs or feet are numb.  Your feet always feel cold. SEEK IMMEDIATE MEDICAL CARE IF:  There is increasing redness, swelling, or pain in or around a wound.  There is a red line that goes up your leg.  Pus is coming from a wound.  You develop a fever or as directed by your health care provider.  You notice a bad smell coming from an ulcer or wound. Document Released: 03/27/2000 Document Revised: 11/30/2012 Document Reviewed: 09/06/2012 [facility] Patient Information 2015 [place], [place]. This information is not intended to replace advice given to you by your health care provider. Make sure you discuss any questions you have with your health care provider.

## 2013-10-31 DIAGNOSIS — E1049 Type 1 diabetes mellitus with other diabetic neurological complication: Secondary | ICD-10-CM | POA: Insufficient documentation

## 2013-11-28 ENCOUNTER — Ambulatory Visit (INDEPENDENT_AMBULATORY_CARE_PROVIDER_SITE_OTHER): Payer: Medicare Other | Admitting: *Deleted

## 2013-11-28 DIAGNOSIS — E1049 Type 1 diabetes mellitus with other diabetic neurological complication: Secondary | ICD-10-CM

## 2013-11-28 NOTE — Progress Notes (Signed)
   Subjective:    Patient ID: Carol Poole, female    DOB: 01/31/1918, 80 y.o.   MRN: [id number]  HPI  Diabetic shoe measurement.  Review of Systems     Objective:   Physical Exam        Assessment & Plan:

## 2014-01-10 ENCOUNTER — Ambulatory Visit (INDEPENDENT_AMBULATORY_CARE_PROVIDER_SITE_OTHER): Payer: Medicare Other | Admitting: Podiatry

## 2014-01-10 DIAGNOSIS — E1049 Type 1 diabetes mellitus with other diabetic neurological complication: Secondary | ICD-10-CM

## 2014-01-10 NOTE — Progress Notes (Signed)
   Subjective:    Patient ID: Carol Poole, female    DOB: 12/28/1917, 80 y.o.   MRN: [id number]  HPI DISPENSED TAN MONK STRAP SHOES SIZE 9.5 WIDE WITH NO DIABETIC INSERTS. Custom molded inserts were not available today for dispensing Patient was insistent on not having custom molded inserts when dispensing diabetic shoes  Review of Systems     Objective:   Physical Exam  Diabetic 9.5 shoe with non-custom insole fits satisfactorily      Assessment & Plan:   Assessment: Satisfactory fit of diabetic shoe No custom orthotic available today for diabetic shoes  Plan: Patient was advised that she must have custom molded heat laminate shoe insole for Medicare compliance Obtained digital scan for custom molded shoe insole and notify patient upon receipt of insole Diabetic shoe indication: Diabetic peripheral neuropathy HAV deformity  Notify patient on receipt of custom molded shoe insole

## 2014-01-31 ENCOUNTER — Encounter: Payer: Self-pay | Admitting: Podiatry

## 2014-01-31 ENCOUNTER — Ambulatory Visit (INDEPENDENT_AMBULATORY_CARE_PROVIDER_SITE_OTHER): Payer: Medicare Other | Admitting: Podiatry

## 2014-01-31 VITALS — BP 134/65 | HR 75 | Resp 12

## 2014-01-31 DIAGNOSIS — B351 Tinea unguium: Secondary | ICD-10-CM

## 2014-01-31 DIAGNOSIS — M79676 Pain in unspecified toe(s): Secondary | ICD-10-CM

## 2014-01-31 DIAGNOSIS — M2010 Hallux valgus (acquired), unspecified foot: Secondary | ICD-10-CM

## 2014-01-31 DIAGNOSIS — E0842 Diabetes mellitus due to underlying condition with diabetic polyneuropathy: Secondary | ICD-10-CM

## 2014-01-31 NOTE — Progress Notes (Signed)
   Subjective:    Patient ID: Carol Poole, female    DOB: Dec 13, 1917, 80 y.o.   MRN: [id number]  HPI DISPENSED DIABETIC SHOES TAN MONK STRAP SIZE 9.5 WIDE WITH 3  PAIR CUSTOM INSERTS. Patient presents today for dispensing of diabetic shoes and custom foot orthotics Patient also complaining of painful toenails and requests debridement   Review of Systems     Objective:   Physical Exam The toenails are elongated, incurvated, hypertrophic 6-10  Diabetic shoes x2 and custom he molded multilaminated shoe insoles x6 contour satisfactorily    Assessment & Plan:   Assessment: Symptomatic onychomycoses 6-10 Indication for dispensing diabetic shoes Diabetic peripheral neuropathy HAV deformity  Plan: Diabetic shoes dispensing wearing instruction provided  The toenails are debrided x10, without bleeding   Reappoint at previous scheduled visit for nail debridement or at three-month intervals

## 2014-01-31 NOTE — Patient Instructions (Signed)

## 2014-05-09 ENCOUNTER — Ambulatory Visit: Payer: Medicare Other | Admitting: Podiatry

## 2014-07-26 ENCOUNTER — Ambulatory Visit
Admission: RE | Admit: 2014-07-26 | Discharge: 2014-07-26 | Disposition: A | Discharge status: Home / Self Care | Payer: Medicare Other | Source: Ambulatory Visit | Attending: Family Medicine | Admitting: Family Medicine

## 2014-07-26 ENCOUNTER — Other Ambulatory Visit: Payer: Self-pay | Admitting: Family Medicine

## 2014-07-26 DIAGNOSIS — M25561 Pain in right knee: Secondary | ICD-10-CM

## 2014-07-26 DIAGNOSIS — M545 Low back pain: Secondary | ICD-10-CM

## 2014-12-18 ENCOUNTER — Encounter: Payer: Self-pay | Admitting: Podiatry

## 2014-12-18 ENCOUNTER — Ambulatory Visit (INDEPENDENT_AMBULATORY_CARE_PROVIDER_SITE_OTHER): Payer: Medicare Other | Admitting: Podiatry

## 2014-12-18 DIAGNOSIS — B351 Tinea unguium: Secondary | ICD-10-CM | POA: Diagnosis not present

## 2014-12-18 DIAGNOSIS — M79676 Pain in unspecified toe(s): Secondary | ICD-10-CM

## 2014-12-18 NOTE — Progress Notes (Signed)
Patient ID: Carol Poole, female   DOB: 03-05-1918, 81 y.o.   MRN: [id number]  Subjective: This patient presents today requesting debridement of painful toenails. Last visit for this service was 01/31/2014  Objective: The toenails are elongated, incurvated, discolored, hypertrophic and tender to direct palpation 6-10  Assessment: Symptomatic onychomycoses 6-10 History of diabetic peripheral neuropathy  Plan: Debridement of toenails 10 and mechanically and electrically. Small bleeding distal fourth left toe treated with antibiotic ointment and Band-Aid. Patient advised to remove Band-Aid and 1-2 days and continue to apply topical antibiotic ointment to the wound site heals.  Reappoint 3 months

## 2014-12-18 NOTE — Patient Instructions (Signed)
Removed Band-Aid on fourth left toe 1-2 days. Apply topical antibiotic ointment to this area once daily until a scab forms  Diabetes and Foot Care Diabetes may cause you to have problems because of poor blood supply (circulation) to your feet and legs. This may cause the skin on your feet to become thinner, break easier, and heal more slowly. Your skin may become dry, and the skin may peel and crack. You may also have nerve damage in your legs and feet causing decreased feeling in them. You may not notice minor injuries to your feet that could lead to infections or more serious problems. Taking care of your feet is one of the most important things you can do for yourself.  HOME CARE INSTRUCTIONS  Wear shoes at all times, even in the house. Do not go barefoot. Bare feet are easily injured.  Check your feet daily for blisters, cuts, and redness. If you cannot see the bottom of your feet, use a mirror or ask someone for help.  Wash your feet with warm water (do not use hot water) and mild soap. Then pat your feet and the areas between your toes until they are completely dry. Do not soak your feet as this can dry your skin.  Apply a moisturizing lotion or petroleum jelly (that does not contain alcohol and is unscented) to the skin on your feet and to dry, brittle toenails. Do not apply lotion between your toes.  Trim your toenails straight across. Do not dig under them or around the cuticle. File the edges of your nails with an emery board or nail file.  Do not cut corns or calluses or try to remove them with medicine.  Wear clean socks or stockings every day. Make sure they are not too tight. Do not wear knee-high stockings since they may decrease blood flow to your legs.  Wear shoes that fit properly and have enough cushioning. To break in new shoes, wear them for just a few hours a day. This prevents you from injuring your feet. Always look in your shoes before you put them on to be sure there are  no objects inside.  Do not cross your legs. This may decrease the blood flow to your feet.  If you find a minor scrape, cut, or break in the skin on your feet, keep it and the skin around it clean and dry. These areas may be cleansed with mild soap and water. Do not cleanse the area with peroxide, alcohol, or iodine.  When you remove an adhesive bandage, be sure not to damage the skin around it.  If you have a wound, look at it several times a day to make sure it is healing.  Do not use heating pads or hot water bottles. They may burn your skin. If you have lost feeling in your feet or legs, you may not know it is happening until it is too late.  Make sure your health care provider performs a complete foot exam at least annually or more often if you have foot problems. Report any cuts, sores, or bruises to your health care provider immediately. SEEK MEDICAL CARE IF:   You have an injury that is not healing.  You have cuts or breaks in the skin.  You have an ingrown nail.  You notice redness on your legs or feet.  You feel burning or tingling in your legs or feet.  You have pain or cramps in your legs and feet.  Your  legs or feet are numb.  Your feet always feel cold. SEEK IMMEDIATE MEDICAL CARE IF:   There is increasing redness, swelling, or pain in or around a wound.  There is a red line that goes up your leg.  Pus is coming from a wound.  You develop a fever or as directed by your health care provider.  You notice a bad smell coming from an ulcer or wound. Document Released: 03/27/2000 Document Revised: 11/30/2012 Document Reviewed: 09/06/2012 [facility] Patient Information 2015 [place], [place]. This information is not intended to replace advice given to you by your health care provider. Make sure you discuss any questions you have with your health care provider.

## 2015-03-04 ENCOUNTER — Ambulatory Visit: Payer: Medicare Other | Attending: Family Medicine

## 2015-03-04 DIAGNOSIS — R293 Abnormal posture: Secondary | ICD-10-CM | POA: Diagnosis present

## 2015-03-04 DIAGNOSIS — R2681 Unsteadiness on feet: Secondary | ICD-10-CM | POA: Insufficient documentation

## 2015-03-04 DIAGNOSIS — M545 Low back pain, unspecified: Secondary | ICD-10-CM

## 2015-03-04 NOTE — Therapy (Signed)
[facility] [address] [place], [place], [address] Phone: [phone]   Fax:  [phone]  Physical Therapy Treatment  Patient Details  Name: Carol Poole MRN: [id number] Date of Birth: 1917-04-30 Referring Provider: Jonathon Jordan, MD  Encounter Date: 03/04/2015      PT End of Session - 03/04/15 1136    Visit Number 1   Number of Visits 10   Date for PT Re-Evaluation 04/29/15   PT Start Time [id number]   PT Stop Time 1144   PT Time Calculation (min) 42 min   Activity Tolerance Patient tolerated treatment well   Behavior During Therapy [facility] for tasks assessed/performed      Past Medical History  Diagnosis Date  . Hypertension   . Diabetes mellitus     type 2 since 1974  . Degeneration, intervertebral disc     DJD, right hip  . Blind     Clinically blind on the left eye  . Renal disorder     CKD stage III  . Hyperlipidemia     Past Surgical History  Procedure Laterality Date  . Abdominal hysterectomy  1950    There were no vitals filed for this visit.  Visit Diagnosis:  Bilateral low back pain without sciatica - Plan: PT plan of care cert/re-cert  Unsteadiness - Plan: PT plan of care cert/re-cert  Posture abnormality - Plan: PT plan of care cert/re-cert      Subjective Assessment - 03/04/15 1105    Subjective Pt is a 81 y.o. female who presents to PT with chronic history of LBP.  Pt walks with a walker full time due to balance deficits.     Pertinent History pt is legally blind in Lt eye   Limitations Standing;Walking   How long can you stand comfortably? 5 minutes max   How long can you walk comfortably? 5 minutes   Diagnostic tests x-rays: DDD    Patient Stated Goals reduce low back pain   Currently in Pain? Yes   Pain Score 8    Pain Location Back   Pain Orientation Right;Left;Lower   Pain Descriptors / Indicators Aching;Tightness;Sore   Pain Type Chronic pain   Pain Radiating Towards  Rt hip   Pain Onset More than a month ago   Pain Frequency Constant   Aggravating Factors  early in the morning, standing/walking   Pain Relieving Factors pain medication, topical rub            [facility] PT Assessment - 03/04/15 0001    Assessment   Medical Diagnosis LBP (M54.5)   Referring Provider Jonathon Jordan, MD   Onset Date/Surgical Date 03/03/10   Next MD Visit 04/2015   Prior Therapy 5 years ago   Precautions   Precautions Fall;Other (comment)  pt is blind in Lt eye- legally blind   Restrictions   Weight Bearing Restrictions No   Balance Screen   Has the patient fallen in the past 6 months No   Has the patient had a decrease in activity level because of a fear of falling?  No   Is the patient reluctant to leave their home because of a fear of falling?  No   Home Environment   Living Environment Private residence   Living Arrangements Alone   Type of [place] Access Level entry   Home Layout One level   [place] - 4 wheels;Cane - single point   Additional Comments Pt has a  aide 7 days a week   Prior Function   Level of Independence Requires assistive device for independence;Independent with household mobility with device;Other (comment)  Aide 7 days a week to assist with shower and cooking   Vocation Retired   Associate Professor   Overall Cognitive Status Within Functional Limits for tasks assessed   Observation/Other Assessments   Focus on Therapeutic Outcomes (FOTO)  76% limitation   Posture/Postural Control   Posture/Postural Control Postural limitations   Postural Limitations Rounded Shoulders;Forward head;Decreased lumbar lordosis;Flexed trunk   ROM / Strength   AROM / PROM / Strength AROM;Strength;PROM   AROM   Overall AROM  Deficits   Overall AROM Comments Tested in sitting due to balance deficits, limited by 25-50% in each direction   PROM   Overall PROM  Deficits   Overall PROM Comments hip flexibility limited by 25%   Strength    Overall Strength Deficits   Overall Strength Comments LE strength 4 to 4+/5 bilaterally   Palpation   Palpation comment Pt with palpable tenderness over Rt SI, gluteals with trigger point.  Mild tenderness over bilateral lumbar paraspinals   Transfers   Transfers Sit to Stand;Stand to Sit   Sit to Stand 6: Modified independent (Device/Increase time);With upper extremity assist;With armrests   Ambulation/Gait   Ambulation/Gait Yes   Ambulation/Gait Assistance 5: Supervision   Ambulation Distance (Feet) 75 Feet   Assistive device Rolling walker   Gait Pattern Step-to pattern;Trunk flexed;Wide base of support;Poor foot clearance - left;Poor foot clearance - right                     [facility] Adult PT Treatment/Exercise - 03/04/15 0001    Modalities   Modalities Moist Heat   Moist Heat Therapy   Number Minutes Moist Heat 10 Minutes   Moist Heat Location Lumbar Spine                PT Education - 03/04/15 1136    Education provided Yes   Education Details HEP: low trunk rotation, single knee to chest   Person(s) Educated Patient   Methods Explanation;Demonstration;Handout   Comprehension Verbalized understanding;Returned demonstration          PT Short Term Goals - 03/04/15 1140    PT SHORT TERM GOAL #1   Title be independent in independent HEP   Time 4   Period Weeks   Status New   PT SHORT TERM GOAL #2   Title report 25% reducion in LBP with standing and walking   Time 4   Period Weeks   Status New           PT Long Term Goals - 03/04/15 1057    PT LONG TERM GOAL #1   Title be independent in advanced HEP   Time 8   Period Weeks   Status New   PT LONG TERM GOAL #2   Title reduce FOTO to < or = to 54% limitation   Time 8   Period Weeks   Status New   PT LONG TERM GOAL #3   Title report a 40% reduction in LBP with standing and walking   Time 8   Period Weeks   Status New   PT LONG TERM GOAL #4   Title stand with upright posture with use  of walker at least 50% of the time   Time 8   Period Weeks   Status New  Plan - 03-25-15 1137    Clinical Impression Statement Pt presents to PT with chronic history of LBP.  Pt ambulates with a rolling walker and demonstrates flexed trunk posture with gait.  Pt with painful mobility, FOTO score of 76% limitation and hip stiffness.  Pt with palpable trigger point of Rt gluteals and parasinals.  Pt will benefit from flexibility, manual/modalities, posture reeducation/gait training and strength to help reduce LBP.     Pt will benefit from skilled therapeutic intervention in order to improve on the following deficits Abnormal gait;Decreased range of motion;Difficulty walking;Pain;Postural dysfunction;Decreased strength;Decreased mobility;Decreased balance;Impaired flexibility;Decreased activity tolerance;Decreased endurance   Rehab Potential Good   PT Frequency 2x / week   PT Duration 8 weeks   PT Treatment/Interventions ADLs/Self Care Home Management;Cryotherapy;Electrical Stimulation;Moist Heat;Therapeutic exercise;Therapeutic activities;Functional mobility training;Gait training;Ultrasound;Neuromuscular re-education;Patient/family education;Manual techniques;Vasopneumatic Device;Passive range of motion   PT Next Visit Plan Work on posture in standing, gentle flexibility, manual and modaliteis PRN   Consulted and Agree with Plan of Care Patient          G-Codes - 03/25/2015 1056    Functional Assessment Tool Used FOTO: 76% limitation   Functional Limitation Other PT primary   Other PT Primary Current Status [id number]) At least 60 percent but less than 80 percent impaired, limited or restricted   Other PT Primary Goal Status [id number]) At least 40 percent but less than 60 percent impaired, limited or restricted      Problem List Patient Active Problem List   Diagnosis Date Noted  . Type I (juvenile type) diabetes mellitus with neurological manifestations, not stated as  uncontrolled 10/31/2013  . Syncope 11/11/2011  . Diabetes 1.5, managed as type 1 ([place]) 11/11/2011  . Hypertension 11/11/2011    Phala Schraeder, PT 2015-03-25, 11:52 AM  [facility] [address] [place], [place], [address] Phone: [phone]   Fax:  [phone]  Name: Carol Poole MRN: [id number] Date of Birth: 1917-07-24

## 2015-03-04 NOTE — Patient Instructions (Signed)
Perform all exercises below:  Hold _20___ seconds. Repeat _2___ times.  Do __3__ sessions per day. CAUTION: Movement should be gentle, steady and slow.  Knee to Chest  Lying supine, bend involved knee to chest. Perform with each leg.    Lumbar Rotation: Caudal - Bilateral (Supine)  Feet and knees together, arms outstretched, rotate knees left, turning head in opposite direction, until stretch is felt.         [facility] [address] [place], [place] [address] Phone # [phone] Fax [phone]

## 2015-03-14 ENCOUNTER — Encounter: Payer: Medicare Other | Admitting: Physical Therapy

## 2015-03-19 ENCOUNTER — Ambulatory Visit: Payer: Medicare Other | Attending: Family Medicine | Admitting: Physical Therapy

## 2015-03-19 DIAGNOSIS — R293 Abnormal posture: Secondary | ICD-10-CM | POA: Insufficient documentation

## 2015-03-19 DIAGNOSIS — R2681 Unsteadiness on feet: Secondary | ICD-10-CM | POA: Diagnosis present

## 2015-03-19 DIAGNOSIS — M545 Low back pain, unspecified: Secondary | ICD-10-CM

## 2015-03-19 NOTE — Patient Instructions (Signed)
Hip Flexion / Knee Extension: Straight-Leg Raise (Eccentric)   Lie on back. Lift leg with knee straight. Slowly lower leg for 3-5 seconds. ___ reps per set, ___ sets per day, ___ days per week. Lower like elevator, stopping at each floor. Add ___ lbs when you achieve ___ repetitions. Rest on elbows. Rest on straight arms.  ABDUCTION: Standing - Resistance Band (Active)   Stand, feet flat. Against yellow resistance band, lift right leg out to side. Complete ___ sets of ___ repetitions. Perform ___ sessions per day.  ADDUCTION: Standing - Stable: Resistance Band (Active)   Stand, right leg out to side as far as possible. Against yellow resistance band, draw leg in across midline. Complete ___ sets of ___ repetitions. Perform ___ sessions per day.  KNEE: Extension, Long Arc Quad (Weight)  Place weight around leg. Raise leg until knee is straight. Hold _5__ seconds. Use ___ lb weight. _10__ reps per set (each leg), 4-5__ sets per day, __7_ days per week    Toe Up   Gently rise up on toes and back on heels. Repeat _20___ times. Do 4-5____ sessions per day.  Posture Tips DO: - stand tall and erect - keep chin tucked in - keep head and shoulders in alignment - check posture regularly in mirror or large window - pull head back against headrest in car seat;  Change your position often.  Sit with lumbar support. DON'T: - slouch or slump while watching TV or reading - sit, stand or lie in one position  for too long;  Sitting is especially hard on the spine so if you sit at a desk/use the computer, then stand up often!   Copyright  VHI. All rights reserved.  Posture - Standing   Good posture is important. Avoid slouching and forward head thrust. Maintain curve in low back and align ears over shoul- ders, hips over ankles.  Pull your belly button in toward your back bone.   Copyright  VHI. All rights reserved.  Posture - Sitting   Sit upright, head facing forward. Try using a roll  to support lower back. Keep shoulders relaxed, and avoid rounded back. Keep hips level with knees. Avoid crossing legs for long periods.   Copyright  VHI. All rights reserved.    [facility] [address] [place], [place] [address] Phone # [phone] Fax [phone]

## 2015-03-19 NOTE — Therapy (Signed)
[facility] [address] [place], [place], [address] Phone: [phone]   Fax:  [phone]  Physical Therapy Treatment  Patient Details  Name: Carol Poole MRN: [id number] Date of Birth: 1917-11-07 Referring Provider: Jonathon Jordan, MD  Encounter Date: 03/19/2015      PT End of Session - 03/19/15 1022    Visit Number 2   Number of Visits 10   Date for PT Re-Evaluation 04/29/15   PT Start Time 0940   PT Stop Time [id number]   PT Time Calculation (min) 35 min   Activity Tolerance Patient tolerated treatment well      Past Medical History  Diagnosis Date  . Hypertension   . Diabetes mellitus     type 2 since 1974  . Degeneration, intervertebral disc     DJD, right hip  . Blind     Clinically blind on the left eye  . Renal disorder     CKD stage III  . Hyperlipidemia     Past Surgical History  Procedure Laterality Date  . Abdominal hysterectomy  1950    There were no vitals filed for this visit.  Visit Diagnosis:  Bilateral low back pain without sciatica  Unsteadiness  Posture abnormality      Subjective Assessment - 03/19/15 1002    Subjective I'm feeling good, I took a pain pill early this morning   Pertinent History pt is legally blind in Lt eye   Diagnostic tests x-rays: DDD    Pain Score 0-No pain                         [facility] Adult PT Treatment/Exercise - 03/19/15 0001    Transfers   Sit to Stand --  x 10 for strengthening   Posture/Postural Control   Posture Comments posture exercises with shoulder retraction, slump/straighten in sitting   Lumbar Exercises: Stretches   Active Hamstring Stretch 2 reps;30 seconds  seated   Single Knee to Chest Stretch 2 reps;30 seconds   Lower Trunk Rotation --  x 60 seconds   Piriformis Stretch 2 reps;30 seconds  seated   Knee/Hip Exercises: Standing   Hip Flexion Stengthening;Both;10 reps  marching   Hip Abduction  Stengthening;Both;10 reps   Other Standing Knee Exercises mini squats x 10   Knee/Hip Exercises: Seated   Long Arc Quad Strengthening;Both;10 reps                PT Education - 03/19/15 1022    Education provided Yes   Education Details HEP   Person(s) Educated Patient   Methods Explanation;Demonstration;Handout   Comprehension Returned demonstration;Verbalized understanding          PT Short Term Goals - 03/19/15 1024    PT SHORT TERM GOAL #1   Title be independent in independent HEP   Time 4   Period Weeks   Status On-going   PT SHORT TERM GOAL #2   Title report 25% reducion in LBP with standing and walking   Time 4   Period Weeks   Status On-going           PT Long Term Goals - 03/19/15 1024    PT LONG TERM GOAL #1   Title be independent in advanced HEP   Time 8   Period Weeks   Status On-going   PT LONG TERM GOAL #2   Title reduce FOTO to < or = to 54% limitation  Time 8   Period Weeks   Status On-going   PT LONG TERM GOAL #3   Title report a 40% reduction in LBP with standing and walking   Time 8   Period Weeks   Status On-going   PT LONG TERM GOAL #4   Title stand with upright posture with use of walker at least 50% of the time   Time 8   Period Weeks   Status On-going               Plan - 03/19/15 1022    Clinical Impression Statement Pt states she has done her stretches at home. Pt states her back hurts occasionally, but heat and stretches are helping   Pt will benefit from skilled therapeutic intervention in order to improve on the following deficits Abnormal gait;Decreased range of motion;Difficulty walking;Pain;Postural dysfunction;Decreased strength;Decreased mobility;Decreased balance;Impaired flexibility;Decreased activity tolerance;Decreased endurance   PT Frequency 2x / week   PT Duration 8 weeks   PT Treatment/Interventions ADLs/Self Care Home Management;Cryotherapy;Electrical Stimulation;Moist Heat;Therapeutic  exercise;Therapeutic activities;Functional mobility training;Gait training;Ultrasound;Neuromuscular re-education;Patient/family education;Manual techniques;Vasopneumatic Device;Passive range of motion   PT Next Visit Plan progress posture exercises and flexibility   Consulted and Agree with Plan of Care Patient        Problem List Patient Active Problem List   Diagnosis Date Noted  . Type I (juvenile type) diabetes mellitus with neurological manifestations, not stated as uncontrolled 10/31/2013  . Syncope 11/11/2011  . Diabetes 1.5, managed as type 1 ([place]) 11/11/2011  . Hypertension 11/11/2011    Isabelle Course, PT, DPT  03/19/2015, 10:25 AM  [facility] [address] [place], [place], [address] Phone: [phone]   Fax:  [phone]  Name: Carol Poole MRN: [id number] Date of Birth: 1917-05-05

## 2015-03-20 ENCOUNTER — Ambulatory Visit: Payer: Medicare Other | Admitting: Podiatry

## 2015-03-21 ENCOUNTER — Ambulatory Visit: Payer: Medicare Other | Admitting: Physical Therapy

## 2015-03-21 DIAGNOSIS — R2681 Unsteadiness on feet: Secondary | ICD-10-CM

## 2015-03-21 DIAGNOSIS — M545 Low back pain, unspecified: Secondary | ICD-10-CM

## 2015-03-21 DIAGNOSIS — R293 Abnormal posture: Secondary | ICD-10-CM

## 2015-03-21 NOTE — Therapy (Addendum)
[facility] [address], [place], [place], [address] Phone: [phone]   Fax:  [phone]  Physical Therapy Treatment  Patient Details  Name: Carol Poole MRN: [id number] Date of Birth: 01/23/1918 Referring Provider: Jonathon Jordan, MD  Encounter Date: 03/21/2015      PT End of Session - 03/21/15 1018    Visit Number 3   Number of Visits 10   Date for PT Re-Evaluation 04/29/15   PT Start Time 0950   PT Stop Time 1015   PT Time Calculation (min) 25 min   Activity Tolerance Patient tolerated treatment well   Behavior During Therapy [facility] for tasks assessed/performed      Past Medical History  Diagnosis Date  . Hypertension   . Diabetes mellitus     type 2 since 1974  . Degeneration, intervertebral disc     DJD, right hip  . Blind     Clinically blind on the left eye  . Renal disorder     CKD stage III  . Hyperlipidemia     Past Surgical History  Procedure Laterality Date  . Abdominal hysterectomy  1950    There were no vitals filed for this visit.  Visit Diagnosis:  Bilateral low back pain without sciatica  Unsteadiness  Posture abnormality      Subjective Assessment - 03/21/15 0957    Subjective I feel good   Pertinent History pt is legally blind in Lt eye   Currently in Pain? No/denies   Pain Score 0-No pain                         [facility] Adult PT Treatment/Exercise - 03/21/15 0001    Lumbar Exercises: Stretches   Active Hamstring Stretch 2 reps;30 seconds  seated   Single Knee to Chest Stretch 2 reps;30 seconds   Lower Trunk Rotation --  x 60 seconds   Piriformis Stretch 2 reps;30 seconds  seated   Knee/Hip Exercises: Supine   Bridges Limitations bridge 2 x 10   Shoulder Exercises: Supine   Horizontal ABduction Strengthening;Both;20 reps;Theraband   Theraband Level (Shoulder Horizontal ABduction) Level 1 (Yellow)   Other Supine Exercises diagonals bilat  yellow t band   Shoulder Exercises: Seated   Retraction 20 reps;Theraband   Theraband Level (Shoulder Retraction) Level 1 (Yellow)                PT Education - 03/21/15 1015    Education provided Yes   Education Details HEP with theraband for shoulder retraction, diagonals, and horiz abd   Person(s) Educated Patient   Methods Explanation;Demonstration;Handout   Comprehension Verbalized understanding;Returned demonstration          PT Short Term Goals - 03/21/15 1019    PT SHORT TERM GOAL #1   Title be independent in independent HEP   Time 4   Period Weeks   Status On-going   PT SHORT TERM GOAL #2   Title report 25% reducion in LBP with standing and walking   Time 4   Period Weeks   Status On-going           PT Long Term Goals - 03/21/15 1020    PT LONG TERM GOAL #1   Title be independent in advanced HEP   Time 8   Period Weeks   Status On-going   PT LONG TERM GOAL #2   Title reduce FOTO to < or = to 54% limitation  Time 8   Period Weeks   Status On-going   PT LONG TERM GOAL #3   Title report a 40% reduction in LBP with standing and walking   Time 8   Period Weeks   Status On-going   PT LONG TERM GOAL #4   Title stand with upright posture with use of walker at least 50% of the time   Time 8   Period Weeks   Status On-going               Plan - 03/21/15 1019    Clinical Impression Statement Pt states she did her stretches and exercises with her iad   Pt will benefit from skilled therapeutic intervention in order to improve on the following deficits Abnormal gait;Decreased range of motion;Difficulty walking;Pain;Postural dysfunction;Decreased strength;Decreased mobility;Decreased balance;Impaired flexibility;Decreased activity tolerance;Decreased endurance   PT Frequency 2x / week   PT Duration 8 weeks   PT Treatment/Interventions ADLs/Self Care Home Management;Cryotherapy;Electrical Stimulation;Moist Heat;Therapeutic exercise;Therapeutic  activities;Functional mobility training;Gait training;Ultrasound;Neuromuscular re-education;Patient/family education;Manual techniques;Vasopneumatic Device;Passive range of motion   PT Next Visit Plan progress posture exercises   Consulted and Agree with Plan of Care Patient        Problem List Patient Active Problem List   Diagnosis Date Noted  . Type I (juvenile type) diabetes mellitus with neurological manifestations, not stated as uncontrolled 10/31/2013  . Syncope 11/11/2011  . Diabetes 1.5, managed as type 1 ([place]) 11/11/2011  . Hypertension 11/11/2011    Isabelle Course, PT, DPT  03/21/2015, 10:22 AM PHYSICAL THERAPY DISCHARGE SUMMARY  Visits from Start of Care: 3  G-Codes: Other PT primary Goal Status: CK D/C status: CL   Current functional level related to goals / functional outcomes: Pt attended 3 PT sessions and will be discharged per pt request.  Pt cancelled all remaining appts due to being very tired and not able to attend PT.     Remaining deficits: See above.  No progress toward goals due to limited attendance.    Education / Equipment: HEP Plan: Patient agrees to discharge.  Patient goals were not met. Patient is being discharged due to the patient's request.  ?????   Sigurd Sos, PT 03/25/2015 10:24 AM  [facility] [address], [place], [place], [address] Phone: [phone]   Fax:  [phone]  Name: Carol Poole MRN: [id number] Date of Birth: [birth year]

## 2015-04-02 ENCOUNTER — Ambulatory Visit: Payer: Medicare Other

## 2015-04-04 ENCOUNTER — Ambulatory Visit: Payer: Medicare Other | Admitting: Physical Therapy

## 2015-04-09 ENCOUNTER — Encounter: Payer: Medicare Other | Admitting: Physical Therapy

## 2015-04-11 ENCOUNTER — Ambulatory Visit: Payer: Medicare Other

## 2017-04-05 ENCOUNTER — Ambulatory Visit
Admission: RE | Admit: 2017-04-05 | Discharge: 2017-04-05 | Disposition: A | Discharge status: Home / Self Care | Payer: Medicare Other | Source: Ambulatory Visit | Attending: Family Medicine | Admitting: Family Medicine

## 2017-04-05 ENCOUNTER — Other Ambulatory Visit: Payer: Self-pay | Admitting: Family Medicine

## 2017-04-05 DIAGNOSIS — W19XXXA Unspecified fall, initial encounter: Secondary | ICD-10-CM

## 2017-07-13 ENCOUNTER — Other Ambulatory Visit: Payer: Self-pay | Admitting: Family Medicine

## 2017-07-13 ENCOUNTER — Ambulatory Visit
Admission: RE | Admit: 2017-07-13 | Discharge: 2017-07-13 | Disposition: A | Discharge status: Home / Self Care | Payer: Medicare Other | Source: Ambulatory Visit | Attending: Family Medicine | Admitting: Family Medicine

## 2017-07-13 DIAGNOSIS — M25572 Pain in left ankle and joints of left foot: Secondary | ICD-10-CM

## 2019-08-08 ENCOUNTER — Other Ambulatory Visit: Payer: Self-pay

## 2019-08-08 ENCOUNTER — Emergency Department (HOSPITAL_COMMUNITY): Payer: Medicare Other

## 2019-08-08 ENCOUNTER — Encounter (HOSPITAL_COMMUNITY): Payer: Self-pay | Admitting: Emergency Medicine

## 2019-08-08 ENCOUNTER — Inpatient Hospital Stay (HOSPITAL_COMMUNITY): Payer: Medicare Other

## 2019-08-08 ENCOUNTER — Inpatient Hospital Stay (HOSPITAL_BASED_OUTPATIENT_CLINIC_OR_DEPARTMENT_OTHER)
Admit: 2019-08-08 | Discharge: 2019-08-08 | Disposition: A | Discharge status: Home / Self Care | Payer: Medicare Other | Attending: Internal Medicine | Admitting: Internal Medicine

## 2019-08-08 ENCOUNTER — Observation Stay (HOSPITAL_COMMUNITY)
Admission: EM | Admit: 2019-08-08 | Discharge: 2019-08-10 | Disposition: A | Discharge status: Home / Self Care | Payer: Medicare Other | Attending: Internal Medicine | Admitting: Internal Medicine

## 2019-08-08 DIAGNOSIS — Z7982 Long term (current) use of aspirin: Secondary | ICD-10-CM | POA: Diagnosis not present

## 2019-08-08 DIAGNOSIS — H543 Unqualified visual loss, both eyes: Secondary | ICD-10-CM | POA: Insufficient documentation

## 2019-08-08 DIAGNOSIS — R4781 Slurred speech: Secondary | ICD-10-CM | POA: Diagnosis not present

## 2019-08-08 DIAGNOSIS — D333 Benign neoplasm of cranial nerves: Secondary | ICD-10-CM | POA: Diagnosis not present

## 2019-08-08 DIAGNOSIS — Z79899 Other long term (current) drug therapy: Secondary | ICD-10-CM | POA: Insufficient documentation

## 2019-08-08 DIAGNOSIS — R531 Weakness: Secondary | ICD-10-CM | POA: Insufficient documentation

## 2019-08-08 DIAGNOSIS — Z66 Do not resuscitate: Secondary | ICD-10-CM | POA: Insufficient documentation

## 2019-08-08 DIAGNOSIS — I639 Cerebral infarction, unspecified: Principal | ICD-10-CM

## 2019-08-08 DIAGNOSIS — N183 Chronic kidney disease, stage 3 unspecified: Secondary | ICD-10-CM | POA: Diagnosis not present

## 2019-08-08 DIAGNOSIS — E119 Type 2 diabetes mellitus without complications: Secondary | ICD-10-CM | POA: Diagnosis not present

## 2019-08-08 DIAGNOSIS — Z20822 Contact with and (suspected) exposure to covid-19: Secondary | ICD-10-CM | POA: Diagnosis not present

## 2019-08-08 DIAGNOSIS — I129 Hypertensive chronic kidney disease with stage 1 through stage 4 chronic kidney disease, or unspecified chronic kidney disease: Secondary | ICD-10-CM | POA: Diagnosis not present

## 2019-08-08 DIAGNOSIS — E1122 Type 2 diabetes mellitus with diabetic chronic kidney disease: Secondary | ICD-10-CM | POA: Diagnosis not present

## 2019-08-08 DIAGNOSIS — R29810 Facial weakness: Secondary | ICD-10-CM | POA: Diagnosis not present

## 2019-08-08 DIAGNOSIS — R0600 Dyspnea, unspecified: Secondary | ICD-10-CM

## 2019-08-08 DIAGNOSIS — Z794 Long term (current) use of insulin: Secondary | ICD-10-CM | POA: Diagnosis not present

## 2019-08-08 DIAGNOSIS — I10 Essential (primary) hypertension: Secondary | ICD-10-CM | POA: Diagnosis present

## 2019-08-08 DIAGNOSIS — R2689 Other abnormalities of gait and mobility: Secondary | ICD-10-CM | POA: Diagnosis not present

## 2019-08-08 DIAGNOSIS — I159 Secondary hypertension, unspecified: Secondary | ICD-10-CM | POA: Diagnosis not present

## 2019-08-08 LAB — RESPIRATORY PANEL BY RT PCR (FLU A&B, COVID)
Influenza A by PCR: NEGATIVE
Influenza B by PCR: NEGATIVE
SARS Coronavirus 2 by RT PCR: NEGATIVE

## 2019-08-08 LAB — URINALYSIS, ROUTINE W REFLEX MICROSCOPIC
Bilirubin Urine: NEGATIVE
Glucose, UA: NEGATIVE mg/dL
Hgb urine dipstick: NEGATIVE
Ketones, ur: NEGATIVE mg/dL
Leukocytes,Ua: NEGATIVE
Nitrite: NEGATIVE
Protein, ur: NEGATIVE mg/dL
Specific Gravity, Urine: 1.014 (ref 1.005–1.030)
pH: 5 (ref 5.0–8.0)

## 2019-08-08 LAB — BASIC METABOLIC PANEL
Anion gap: 11 (ref 5–15)
BUN: 24 mg/dL — ABNORMAL HIGH (ref 8–23)
CO2: 22 mmol/L (ref 22–32)
Calcium: 8.7 mg/dL — ABNORMAL LOW (ref 8.9–10.3)
Chloride: 101 mmol/L (ref 98–111)
Creatinine, Ser: 0.88 mg/dL (ref 0.44–1.00)
GFR calc Af Amer: >60 mL/min (ref >60)
GFR calc non Af Amer: 54 mL/min — ABNORMAL LOW (ref >60)
Glucose, Bld: 192 mg/dL — ABNORMAL HIGH (ref 70–99)
Potassium: 4 mmol/L (ref 3.5–5.1)
Sodium: 134 mmol/L — ABNORMAL LOW (ref 135–145)

## 2019-08-08 LAB — CBC
HCT: 35.6 % — ABNORMAL LOW (ref 36.0–46.0)
Hemoglobin: 11.6 g/dL — ABNORMAL LOW (ref 12.0–15.0)
MCH: 32.8 pg (ref 26.0–34.0)
MCHC: 32.6 g/dL (ref 30.0–36.0)
MCV: 100.6 fL — ABNORMAL HIGH (ref 80.0–100.0)
Platelets: 236 10*3/uL (ref 150–400)
RBC: 3.54 MIL/uL — ABNORMAL LOW (ref 3.87–5.11)
RDW: 14.4 % (ref 11.5–15.5)
WBC: 8.4 10*3/uL (ref 4.0–10.5)
nRBC: 0 % (ref 0.0–0.2)

## 2019-08-08 LAB — CBG MONITORING, ED: Glucose-Capillary: 181 mg/dL — ABNORMAL HIGH (ref 70–99)

## 2019-08-08 MED ORDER — PREDNISOLONE ACETATE 1 % OP SUSP
1.0000 [drp] | Freq: Two times a day (BID) | OPHTHALMIC | Status: DC
  Filled 2019-08-08: qty 5

## 2019-08-08 MED ORDER — ACETAMINOPHEN 650 MG RE SUPP: 650.0000 mg | RECTAL | Status: DC | PRN

## 2019-08-08 MED ORDER — SENNOSIDES-DOCUSATE SODIUM 8.6-50 MG PO TABS: 1.0000 | ORAL_TABLET | Freq: Every evening | ORAL | Status: DC | PRN

## 2019-08-08 MED ORDER — ASPIRIN 300 MG RE SUPP
300.0000 mg | Freq: Every day | RECTAL | Status: DC
  Administered 2019-08-09: 09:00:00 300 mg via RECTAL
  Filled 2019-08-08: qty 1

## 2019-08-08 MED ORDER — SODIUM CHLORIDE 0.9% FLUSH: 3.0000 mL | Freq: Once | INTRAVENOUS | Status: DC

## 2019-08-08 MED ORDER — ACETAMINOPHEN 160 MG/5ML PO SOLN: 650.0000 mg | ORAL | Status: DC | PRN

## 2019-08-08 MED ORDER — ASPIRIN 325 MG PO TABS
325.0000 mg | ORAL_TABLET | Freq: Every day | ORAL | Status: DC
  Administered 2019-08-08: 19:00:00 325 mg via ORAL
  Filled 2019-08-08: qty 1

## 2019-08-08 MED ORDER — ENOXAPARIN SODIUM 40 MG/0.4ML ~~LOC~~ SOLN: 40.0000 mg | SUBCUTANEOUS | Status: DC

## 2019-08-08 MED ORDER — ACETAMINOPHEN 325 MG PO TABS: 650.0000 mg | ORAL_TABLET | ORAL | Status: DC | PRN

## 2019-08-08 MED ORDER — STROKE: EARLY STAGES OF RECOVERY BOOK
Freq: Once | Status: AC
  Filled 2019-08-08: qty 1

## 2019-08-08 NOTE — Progress Notes (Signed)
Carotid artery duplex has been completed. Preliminary results can be found in CV Proc through chart review.   08/08/19 4:28 PM Carlos Levering RVT

## 2019-08-08 NOTE — ED Notes (Signed)
Provided pt with food tray, daughter assisting with pt feeding

## 2019-08-08 NOTE — Consult Note (Signed)
Requesting Physician: Dr. Jen Mow    Chief Complaint: Right-sided weakness  History obtained from: Patient and Chart   HPI:                                                                                                                                       Carol Poole is a 86 y.o. female with past medical history of insulin-dependent diabetes mellitus, hypertension, hyperlipidemia, CKD, bilateral blindness presents to the emergency department with 3-week history of impaired gait, right-sided weakness and confusion.  According to the daughter, April 9 she noticed that her mother was not walking as well.  She went to her PCP who took her off multiple medications and the patient seemed to be doing better.  Then last week patient had an episode where she appeared weaker on the right side and had trouble walking and speech was slurred.  The daughter wanted to bring her to the hospital but patient refused.  She subsequently improved, however since Saturday the patient has been declining and increasingly confused.  She had a fall today and daughter decided to bring her to the emergency department.  On examination, patient noted to be weak on the right side.  Work-up included CT head which showed a subacute infarct in the left basal ganglia.  Metabolic work-up including UA pending.  Sodium 134, potassium 4.0.  Date last known well: 2 to 3 weeks ago tPA Given: Outside window Baseline MRS 2    Past Medical History:  Diagnosis Date  . Blind    Clinically blind on the left eye  . Degeneration, intervertebral disc    DJD, right hip  . Diabetes mellitus    type 2 since 1974  . Hyperlipidemia   . Hypertension   . Renal disorder    CKD stage III    Past Surgical History:  Procedure Laterality Date  . ABDOMINAL HYSTERECTOMY  1950    Family History  Problem Relation Age of Onset  . Hypertension Mother   . Hypertension Father    Social History:  reports that she has never smoked. She has  never used smokeless tobacco. She reports that she does not drink alcohol or use drugs.  Allergies:  Allergies  Allergen Reactions  . Other Other (See Comments)    PT is a [place] witness per daughter. NO Blood products.   . Corticosteroids Other (See Comments)    Hearing loss.     Medications:  I reviewed home medications  ROS:                                                                                                                                     14 systems reviewed and negative except above    Examination:                                                                                                      General: Appears well-developed  Psych: Affect appropriate to situation Eyes: No scleral injection HENT: No OP obstrucion Head: Normocephalic.  Cardiovascular: Normal rate and regular rhythm.  Respiratory: Effort normal and breath sounds normal to anterior ascultation GI: Soft.  No distension. There is no tenderness.  Skin: WDI    Neurological Examination Mental Status: Alert, oriented, thought content appropriate.  Speech fluent without evidence of aphasia. Able to follow 3 step commands without difficulty. Cranial Nerves: II: Visual fields : Unable to finger count in both eyes. III,IV, VI: ptosis not present, extra-ocular motions intact bilaterally, pupils equal, round, reactive to light and accommodation V,VII: smile symmetric, facial light touch sensation normal bilaterally VIII: hearing normal bilaterally IX,X: uvula rises symmetrically XI: bilateral shoulder shrug XII: midline tongue extension Motor: Right : Upper extremity   4/5    Left:     Upper extremity   5/5  Lower extremity   3/5     Lower extremity   5/5 Tone and bulk:normal tone throughout; no atrophy noted Sensory: Reduced sensation in the right arm and right leg compared to  the left side to light touch Deep Tendon Reflexes: 1+ and symmetric throughout Plantars: Right: downgoing   Left: downgoing Cerebellar: No gross ataxia noted      Lab Results: Basic Metabolic Panel: Recent Labs  Lab 08/08/19 1246  NA 134*  K 4.0  CL 101  CO2 22  GLUCOSE 192*  BUN 24*  CREATININE 0.88  CALCIUM 8.7*    CBC: Recent Labs  Lab 08/08/19 1246  WBC 8.4  HGB 11.6*  HCT 35.6*  MCV 100.6*  PLT 236    Coagulation Studies: No results for input(s): LABPROT, INR in the last 72 hours.  Imaging: CT Head Wo Contrast  Result Date: 08/08/2019 CLINICAL DATA:  Weakness and gait disturbance developing earlier this month. Right-sided weakness. Left facial droop. Slurred speech beginning 3 days ago. EXAM: CT HEAD WITHOUT CONTRAST TECHNIQUE: Contiguous axial images were obtained from the base of the skull through the vertex without intravenous contrast. COMPARISON:  11/11/2011 FINDINGS: Brain: Brainstem and cerebellum are unremarkable. There is probably an acute to subacute stroke in the left basal ganglia/posterior limb internal capsule. Old small vessel infarction in the radiating white matter tracts above that. Mild chronic small-vessel change of the cerebral hemispheric white matter. No large vessel territory infarction. No mass lesion, hemorrhage, hydrocephalus or extra-axial collection. Vascular: There is atherosclerotic calcification of the major vessels at the base of the brain. Skull: Negative Sinuses/Orbits: Clear/normal Other: None IMPRESSION: Acute to subacute infarction in the left basal ganglia/posterior limb internal capsule. Older small vessel infarction in the radiating white matter tracts just above that. No hemorrhage or mass effect. Electronically Signed   By: Nelson Chimes M.D.   On: 08/08/2019 15:03   MR BRAIN WO CONTRAST  Result Date: 08/08/2019 CLINICAL DATA:  Mental status decline beginning a few weeks ago. Ataxia. EXAM: MRI HEAD WITHOUT CONTRAST TECHNIQUE:  Multiplanar, multiecho pulse sequences of the brain and surrounding structures were obtained without intravenous contrast. COMPARISON:  Head CT same day FINDINGS: Brain: Diffusion imaging shows old infarction in the left basal ganglia/posterior limb internal capsule with some T2 shine through of the surrounding brain. I doubt that there is an acute infarction in this location. Elsewhere, there chronic small-vessel changes of the pons. No focal cerebellar insult. Mild chronic small-vessel change elsewhere affecting the cerebral hemispheric white matter. No intra-axial mass, hemorrhage, hydrocephalus or extra-axial collection. 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 Vascular: Major vessels at the base of the brain show flow. Skull and upper cervical spine: Negative Sinuses/Orbits: Chronic atrophic changes of the left globe. Sinuses clear. Other: None IMPRESSION: MRI scan suggests that the left basal ganglia/posterior limb internal capsule infarction is old. There is some T2 shine through signal but I doubt the presence of actual infarction along the margins of that old infarction. Elsewhere, the brain shows only minimal small vessel ischemic change of the white matter. 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 No mass-effect upon the brain. Electronically Signed   By: Nelson Chimes M.D.   On: 08/08/2019 17:32   VAS US CAROTID (at [facility] and [facility] only)  Result Date: 08/08/2019 Carotid Arterial Duplex Study Indications:       CVA. Limitations        Today's exam was limited due to the high bifurcation of the                    carotid, the patient's respiratory variation and Vessel                    tortuosity. Comparison Study:  No prior studies. Performing Technologist: Oliver Hum RVT  Examination Guidelines: A complete evaluation includes B-mode imaging, spectral Doppler, color Doppler, and power Doppler as  needed of all accessible portions of each vessel. Bilateral testing is considered an integral part of a complete examination. Limited examinations for reoccurring indications may be performed as noted.  Right Carotid Findings: +----------+-------+-------+--------+---------------------------------+--------+           PSV    EDV    StenosisPlaque Description               Comments           cm/s   cm/s                                                     +----------+-------+-------+--------+---------------------------------+--------+  CCA Prox  44     6              smooth and heterogenous                   +----------+-------+-------+--------+---------------------------------+--------+ CCA Distal59     12             smooth and heterogenous                   +----------+-------+-------+--------+---------------------------------+--------+ ICA Prox  86     15             irregular, heterogenous and                                               calcific                                  +----------+-------+-------+--------+---------------------------------+--------+ ICA Distal67     19                                              tortuous +----------+-------+-------+--------+---------------------------------+--------+ ECA       128    11                                                       +----------+-------+-------+--------+---------------------------------+--------+ +----------+--------+-------+--------+-------------------+           PSV cm/sEDV cmsDescribeArm Pressure (mmHG) +----------+--------+-------+--------+-------------------+ [id number]                                         +----------+--------+-------+--------+-------------------+ +---------+--------+--+--------+--+---------+ VertebralPSV cm/s85EDV cm/s17Antegrade +---------+--------+--+--------+--+---------+  Left Carotid Findings:  +----------+--------+--------+--------+--------------------------+--------+           PSV cm/sEDV cm/sStenosisPlaque Description        Comments +----------+--------+--------+--------+--------------------------+--------+ CCA Prox  56      7               irregular and heterogenoustortuous +----------+--------+--------+--------+--------------------------+--------+ CCA Distal54      7               smooth and heterogenous            +----------+--------+--------+--------+--------------------------+--------+ ICA Prox  74      20              smooth and heterogenous   tortuous +----------+--------+--------+--------+--------------------------+--------+ ICA Distal54      17                                                 +----------+--------+--------+--------+--------------------------+--------+ ECA       88      0                                                  +----------+--------+--------+--------+--------------------------+--------+ +----------+--------+--------+--------+-------------------+  PSV cm/sEDV cm/sDescribeArm Pressure (mmHG) +----------+--------+--------+--------+-------------------+ [id number]                                         +----------+--------+--------+--------+-------------------+ +---------+--------+--+--------+-+---------+ VertebralPSV cm/s20EDV cm/s2Antegrade +---------+--------+--+--------+-+---------+   Summary: Right Carotid: Velocities in the right ICA are consistent with a 1-39% stenosis. Left Carotid: Velocities in the left ICA are consistent with a 1-39% stenosis. Vertebrals: Bilateral vertebral arteries demonstrate antegrade flow. *See table(s) above for measurements and observations.     Preliminary      ASSESSMENT AND PLAN   86 y.o. female with past medical history of insulin-dependent diabetes mellitus, hypertension, hyperlipidemia, CKD, bilateral blindness presents to the emergency department with  3-week history of impaired gait, right-sided weakness and confusion, likely due to subacute stroke.  Outside the window for any acute intervention.  Also, could be recrudescence of symptoms and would recommend checking UA and chest x-ray.  Etiology of stroke likely small vessel disease.  Right subacute infarction Possible recrudescence of old stroke symptoms   Recommendations Metabolic and infectious work-up such as UA, chest x-ray Aspirin 81 mg daily (no need for dual antiplatelets as patient at least a week from stroke) Atorvastatin 40 mg daily Blood pressure goal normotension Carotid Dopplers and echocardiogram Stroke swallow evaluation PT OT evaluation  Sherlonda Flater [facility] Pager Number [phone]

## 2019-08-08 NOTE — ED Provider Notes (Addendum)
[facility] DEPT Provider Note   CSN: [id number] Arrival date & time: 08/08/19  1025     History Chief Complaint  Patient presents with  . Weakness    Carol Poole is a 86 y.o. female.  HPI     86 year old female comes in a chief complaint of weakness.  Patient has history of hypertension, hyperlipidemia, CKD, diabetes.  She lives by herself in an independent living facility.  According to family, about 2 weeks ago patient suddenly started having a decline.  She has been having weakness with movement and activity, particularly they have noted that there is weakness of the right side.  Patient can no longer function on her own.  Family had notified PCP.  PT OT evaluation have been started.  Given patient's persistent decline, it was decided to bring her to the ER.  Patient does not have any fevers, chills.  P.o. intake has been normal.  Patient has received a COVID-19 vaccine  Past Medical History:  Diagnosis Date  . Blind    Clinically blind on the left eye  . Degeneration, intervertebral disc    DJD, right hip  . Diabetes mellitus    type 2 since 1974  . Hyperlipidemia   . Hypertension   . Renal disorder    CKD stage III    Patient Active Problem List   Diagnosis Date Noted  . CVA (cerebral vascular accident) ([place]) 08/08/2019  . Type 2 diabetes mellitus without complication ([place]) [id number]  . Type I (juvenile type) diabetes mellitus with neurological manifestations, not stated as uncontrolled(250.61) 10/31/2013  . Syncope 11/11/2011  . Diabetes 1.5, managed as type 1 ([place]) 11/11/2011  . Hypertension 11/11/2011    Past Surgical History:  Procedure Laterality Date  . ABDOMINAL HYSTERECTOMY  1950     OB History   No obstetric history on file.     Family History  Problem Relation Age of Onset  . Hypertension Mother   . Hypertension Father     Social History   Tobacco Use  . Smoking status: Never Smoker  . Smokeless  tobacco: Never Used  Substance Use Topics  . Alcohol use: No  . Drug use: No    Home Medications Prior to Admission medications   Medication Sig Start Date End Date Taking? Authorizing Provider  cephALEXin (KEFLEX) 500 MG capsule Take 500 mg by mouth every 12 (twelve) hours. 08/03/19  Yes [provider]  Cholecalciferol (VITAMIN D PO) Take 1 tablet by mouth every Monday, Wednesday, and Friday.    Yes [provider]  Insulin Detemir (LEVEMIR FLEXPEN) 100 UNIT/ML SOPN Inject 20 Units into the skin as needed (when sugar is over 200).    Yes [provider]  Multiple Vitamins-Minerals (CENTRUM SILVER PO) Take 1 tablet by mouth daily.    Yes [provider]  oxyCODONE-acetaminophen (PERCOCET/ROXICET) 5-325 MG per tablet Take 0.5-1 tablets by mouth every 6 (six) hours as needed for pain. Patient taking differently: Take 1 tablet by mouth every 6 (six) hours as needed for moderate pain.  08/18/12  Yes Truddie Hidden, MD  Polyethylene Glycol 3350 (MIRALAX PO) Take 1 Dose by mouth as needed (constipation).    Yes [provider]  prednisoLONE acetate (PRED FORTE) 1 % ophthalmic suspension Place 1 drop into both eyes 2 (two) times daily.   Yes [provider]    Allergies    Other and Corticosteroids  Review of Systems   Review of Systems  Constitutional:  Positive for activity change.  Respiratory: Negative for shortness of breath.   Cardiovascular: Negative for chest pain.  Gastrointestinal: Negative for nausea and vomiting.  Neurological: Positive for weakness. Negative for dizziness.  All other systems reviewed and are negative.   Physical Exam Updated Vital Signs BP 127/74   Pulse 75   Temp 98 F (36.7 C) (Oral)   Resp (!) 27   Ht 5\' 2"  (1.575 m)   SpO2 97%   BMI 32.01 kg/m   Physical Exam Vitals and nursing note reviewed.  Constitutional:      Appearance: She is well-developed.  HENT:     Head: Normocephalic and  atraumatic.  Cardiovascular:     Rate and Rhythm: Normal rate.  Pulmonary:     Effort: Pulmonary effort is normal.  Abdominal:     General: Bowel sounds are normal.  Musculoskeletal:     Cervical back: Normal range of motion and neck supple.  Skin:    General: Skin is warm and dry.  Neurological:     Mental Status: She is alert and oriented to person, place, and time.     Comments: Patient has mild droop on the left side of her face.  She is also noted to have right upper extremity (4-/5)  and right lower extremity weakness (2/5).  The weakness in her right lower extremity is more pronounced.  Patient also reports numbness to the right upper extremity.     ED Results / Procedures / Treatments   Labs (all labs ordered are listed, but only abnormal results are displayed) Labs Reviewed  BASIC METABOLIC PANEL - Abnormal; Notable for the following components:      Result Value   Sodium 134 (*)    Glucose, Bld 192 (*)    BUN 24 (*)    Calcium 8.7 (*)    GFR calc non Af Amer 54 (*)    All other components within normal limits  CBC - Abnormal; Notable for the following components:   RBC 3.54 (*)    Hemoglobin 11.6 (*)    HCT 35.6 (*)    MCV 100.6 (*)    All other components within normal limits  CBG MONITORING, ED - Abnormal; Notable for the following components:   Glucose-Capillary 181 (*)    All other components within normal limits  RESPIRATORY PANEL BY RT PCR (FLU A&B, COVID)  URINALYSIS, ROUTINE W REFLEX MICROSCOPIC  HEMOGLOBIN A1C  LIPID PANEL    EKG EKG Interpretation  Date/Time:  Tuesday August 08 2019 13:14:18 EDT Ventricular Rate:  81 PR Interval:    QRS Duration: 133 QT Interval:  402 QTC Calculation: 467 R Axis:   -78 Text Interpretation: Sinus rhythm RBBB and LAFB LVH with secondary repolarization abnormality No acute changes No significant change since last tracing Confirmed by Varney Biles [id number]) on 08/08/2019 4:46:54 PM   Radiology CT Head Wo  Contrast  Result Date: 08/08/2019 CLINICAL DATA:  Weakness and gait disturbance developing earlier this month. Right-sided weakness. Left facial droop. Slurred speech beginning 3 days ago. EXAM: CT HEAD WITHOUT CONTRAST TECHNIQUE: Contiguous axial images were obtained from the base of the skull through the vertex without intravenous contrast. COMPARISON:  11/11/2011 FINDINGS: Brain: Brainstem and cerebellum are unremarkable. There is probably an acute to subacute stroke in the left basal ganglia/posterior limb internal capsule. Old small vessel infarction in the radiating white matter tracts above that. Mild chronic small-vessel change of the cerebral hemispheric white matter. No large vessel territory infarction. No  mass lesion, hemorrhage, hydrocephalus or extra-axial collection. Vascular: There is atherosclerotic calcification of the major vessels at the base of the brain. Skull: Negative Sinuses/Orbits: Clear/normal Other: None IMPRESSION: Acute to subacute infarction in the left basal ganglia/posterior limb internal capsule. Older small vessel infarction in the radiating white matter tracts just above that. No hemorrhage or mass effect. Electronically Signed   By: Nelson Chimes M.D.   On: 08/08/2019 15:03   VAS US CAROTID (at [facility] and [facility] only)  Result Date: 08/08/2019 Carotid Arterial Duplex Study Indications:       CVA. Limitations        Today's exam was limited due to the high bifurcation of the                    carotid, the patient's respiratory variation and Vessel                    tortuosity. Comparison Study:  No prior studies. Performing Technologist: Oliver Hum RVT  Examination Guidelines: A complete evaluation includes B-mode imaging, spectral Doppler, color Doppler, and power Doppler as needed of all accessible portions of each vessel. Bilateral testing is considered an integral part of a complete examination. Limited examinations for reoccurring indications may be performed as noted.   Right Carotid Findings: +----------+-------+-------+--------+---------------------------------+--------+           PSV    EDV    StenosisPlaque Description               Comments           cm/s   cm/s                                                     +----------+-------+-------+--------+---------------------------------+--------+ CCA Prox  44     6              smooth and heterogenous                   +----------+-------+-------+--------+---------------------------------+--------+ CCA Distal59     12             smooth and heterogenous                   +----------+-------+-------+--------+---------------------------------+--------+ ICA Prox  86     15             irregular, heterogenous and                                               calcific                                  +----------+-------+-------+--------+---------------------------------+--------+ ICA Distal67     19                                              tortuous +----------+-------+-------+--------+---------------------------------+--------+ ECA       128    11                                                       +----------+-------+-------+--------+---------------------------------+--------+ +----------+--------+-------+--------+-------------------+  PSV cm/sEDV cmsDescribeArm Pressure (mmHG) +----------+--------+-------+--------+-------------------+ [id number]                                         +----------+--------+-------+--------+-------------------+ +---------+--------+--+--------+--+---------+ VertebralPSV cm/s85EDV cm/s17Antegrade +---------+--------+--+--------+--+---------+  Left Carotid Findings: +----------+--------+--------+--------+--------------------------+--------+           PSV cm/sEDV cm/sStenosisPlaque Description        Comments +----------+--------+--------+--------+--------------------------+--------+ CCA Prox  56      7                irregular and heterogenoustortuous +----------+--------+--------+--------+--------------------------+--------+ CCA Distal54      7               smooth and heterogenous            +----------+--------+--------+--------+--------------------------+--------+ ICA Prox  74      20              smooth and heterogenous   tortuous +----------+--------+--------+--------+--------------------------+--------+ ICA Distal54      17                                                 +----------+--------+--------+--------+--------------------------+--------+ ECA       88      0                                                  +----------+--------+--------+--------+--------------------------+--------+ +----------+--------+--------+--------+-------------------+           PSV cm/sEDV cm/sDescribeArm Pressure (mmHG) +----------+--------+--------+--------+-------------------+ [id number]                                         +----------+--------+--------+--------+-------------------+ +---------+--------+--+--------+-+---------+ VertebralPSV cm/s20EDV cm/s2Antegrade +---------+--------+--+--------+-+---------+   Summary: Right Carotid: Velocities in the right ICA are consistent with a 1-39% stenosis. Left Carotid: Velocities in the left ICA are consistent with a 1-39% stenosis. Vertebrals: Bilateral vertebral arteries demonstrate antegrade flow. *See table(s) above for measurements and observations.     Preliminary     Procedures Procedures (including critical care time)  Medications Ordered in ED Medications  sodium chloride flush (NS) 0.9 % injection 3 mL (has no administration in time range)  prednisoLONE acetate (PRED FORTE) 1 % ophthalmic suspension 1 drop (has no administration in time range)  acetaminophen (TYLENOL) tablet 650 mg (has no administration in time range)    Or  acetaminophen (TYLENOL) 160 MG/5ML solution 650 mg (has no administration in time  range)    Or  acetaminophen (TYLENOL) suppository 650 mg (has no administration in time range)  senna-docusate (Senokot-S) tablet 1 tablet (has no administration in time range)  enoxaparin (LOVENOX) injection 40 mg (has no administration in time range)  aspirin suppository 300 mg (has no administration in time range)    Or  aspirin tablet 325 mg (has no administration in time range)   stroke: mapping our early stages of recovery book ( Does not apply Given 08/08/19 1621)    ED Course  I have reviewed the triage vital signs and the nursing notes.  Pertinent labs & imaging results that were available during  my care of the patient were reviewed by me and considered in my medical decision making (see chart for details).  Clinical Course as of Aug 07 1717  Tue Aug 08, 2019  1647 CT scan does show clear subacute infarct.  Neurology has been consulted x2, I still have not heard back from them at 1648. Medicine will be admitting the patient.  CT Head Wo Contrast [AN]    Clinical Course User Index [AN] Varney Biles, MD   MDM Rules/Calculators/A&P                      86 year old female comes in with chief complaint of weakness.  Patient is noted to have right upper and lower extremity weakness and left-sided facial droop.  Based on history and exam, clinical concerns are high for subacute stroke.  Patient is well outside of any treatment window.  The other considerations include UTI, electrolyte abnormality, myelitis.   Final Clinical Impression(s) / ED Diagnoses Final diagnoses:  Acute ischemic stroke [facility])    Rx / DC Orders ED Discharge Orders    None       Varney Biles, MD 08/08/19 [place], Angle Karel, MD 08/08/19 1719

## 2019-08-08 NOTE — ED Notes (Signed)
Pure wic placed ini efforts to obtain urinary sample. Pt would like to use restroom as she is typically continent but it was explained that with increased weakness it would be safer to use the pure wic at this time.

## 2019-08-08 NOTE — ED Notes (Signed)
Pt transported to MRI 

## 2019-08-08 NOTE — ED Notes (Signed)
ED Provider at bedside. 

## 2019-08-08 NOTE — H&P (Addendum)
History and Physical    Carol Poole [id number] DOB: [birth year] DOA: 08/08/2019  PCP: Jonathon Jordan, MD   Patient coming from: Home  I have personally briefly reviewed patient's old medical records in [place]  Chief Complaint: Weakness  HPI: Carol Poole is a 86 y.o. female with medical history significant of diabetes mellitus type II, hypertension presented with worsening weakness for the last 2 days.  Patient was found to have left-sided facial droop and worsening right-sided weakness along with some loss of balance and slurred speech that started Saturday.  Daughter reported that she notified PCP on Saturday of the facial droop and PCP advised to keep an eye on the patient.  Patient has been having bilateral lower extremity weakness since 07/21/2019 for which she was seen by PCP and recommended home physical therapy.  Patient was seen by therapist today who recommended that patient be sent to ED. daughter complained that patient is slightly more confused today.  She has had 2 falls over the last few weeks including a fall this morning.  No history of loss of consciousness, seizures, severe headache, fever, chills, nausea, vomiting, abdominal pain, diarrhea.  ED Course: CT of the head showed acute to subacute infarction in the left basal ganglia/posterior limb internal capsule.  Neurology has been consulted by the ED provider. Hospitalist service was called to evaluate the patient.  Review of Systems: Could not be obtained because patient is a poor historian and is slightly confused.  Past Medical History:  Diagnosis Date  . Blind    Clinically blind on the left eye  . Degeneration, intervertebral disc    DJD, right hip  . Diabetes mellitus    type 2 since 1974  . Hyperlipidemia   . Hypertension   . Renal disorder    CKD stage III    Past Surgical History:  Procedure Laterality Date  . ABDOMINAL HYSTERECTOMY  1950   Social history  reports that she has never  smoked. She has never used smokeless tobacco. She reports that she does not drink alcohol or use drugs.  Allergies  Allergen Reactions  . Corticosteroids Other (See Comments)    Hearing loss.     Family History  Problem Relation Age of Onset  . Hypertension Mother   . Hypertension Father     Prior to Admission medications   Medication Sig Start Date End Date Taking? Authorizing Provider  cephALEXin (KEFLEX) 500 MG capsule Take 500 mg by mouth every 12 (twelve) hours. 08/03/19  Yes [provider]  Cholecalciferol (VITAMIN D PO) Take 1 tablet by mouth every Monday, Wednesday, and Friday.    Yes [provider]  Insulin Detemir (LEVEMIR FLEXPEN) 100 UNIT/ML SOPN Inject 20 Units into the skin as needed (when sugar is over 200).    Yes [provider]  Multiple Vitamins-Minerals (CENTRUM SILVER PO) Take 1 tablet by mouth daily.    Yes [provider]  oxyCODONE-acetaminophen (PERCOCET/ROXICET) 5-325 MG per tablet Take 0.5-1 tablets by mouth every 6 (six) hours as needed for pain. Patient taking differently: Take 1 tablet by mouth every 6 (six) hours as needed for moderate pain.  08/18/12  Yes Truddie Hidden, MD  Polyethylene Glycol 3350 (MIRALAX PO) Take 1 Dose by mouth as needed (constipation).    Yes [provider]  prednisoLONE acetate (PRED FORTE) 1 % ophthalmic suspension Place 1 drop into both eyes 2 (two) times daily.   Yes [provider]    Physical Exam: Vitals:  08/08/19 1412 08/08/19 1413 08/08/19 1414 08/08/19 1438  BP: 136/68   128/60  Pulse: 87  87 80  Resp: (!) 24 (!) 23 (!) 24 19  Temp:      TempSrc:      SpO2: 100%  100% 99%  Height:        Constitutional: Elderly female lying in bed.  Hard of hearing.  No acute distress.  Poor historian. Vitals:   08/08/19 1412 08/08/19 1413 08/08/19 1414 08/08/19 1438  BP: 136/68   128/60  Pulse: 87  87 80  Resp: (!) 24 (!) 23 (!) 24 19  Temp:      TempSrc:        SpO2: 100%  100% 99%  Height:       Eyes: PERRL, lids and conjunctivae normal ENMT: Mucous membranes are moist. Posterior pharynx clear of any exudate or lesions. Neck: normal, supple, no masses, no thyromegaly Respiratory: bilateral decreased breath sounds at bases, no wheezing, no crackles.  Tachypneic.  No accessory muscle use.  Cardiovascular: S1 S2 positive, rate controlled. No extremity edema. 2+ pedal pulses.  Abdomen: no tenderness, no masses palpated. No hepatosplenomegaly. Bowel sounds positive.  Musculoskeletal: no clubbing / cyanosis. No joint deformity upper and lower extremities.  Skin: no rashes, lesions, ulcers. No induration Neurologic: Left-sided facial droop present.  All 4 extremities are generally weak but right-sided weakness is more prominent.  Poor historian, slightly confused.  Psychiatric: Could not be assessed because of mental status.   Labs on Admission: I have personally reviewed following labs and imaging studies  CBC: Recent Labs  Lab 08/08/19 1246  WBC 8.4  HGB 11.6*  HCT 35.6*  MCV 100.6*  PLT [id number]   Basic Metabolic Panel: Recent Labs  Lab 08/08/19 1246  NA 134*  K 4.0  CL 101  CO2 22  GLUCOSE 192*  BUN 24*  CREATININE 0.88  CALCIUM 8.7*   GFR: CrCl cannot be calculated (Unknown ideal weight.). Liver Function Tests: No results for input(s): AST, ALT, ALKPHOS, BILITOT, PROT, ALBUMIN in the last 168 hours. No results for input(s): LIPASE, AMYLASE in the last 168 hours. No results for input(s): AMMONIA in the last 168 hours. Coagulation Profile: No results for input(s): INR, PROTIME in the last 168 hours. Cardiac Enzymes: No results for input(s): CKTOTAL, CKMB, CKMBINDEX, TROPONINI in the last 168 hours. BNP (last 3 results) No results for input(s): PROBNP in the last 8760 hours. HbA1C: No results for input(s): HGBA1C in the last 72 hours. CBG: Recent Labs  Lab 08/08/19 1419  GLUCAP 181*   Lipid Profile: No results for  input(s): CHOL, HDL, LDLCALC, TRIG, CHOLHDL, LDLDIRECT in the last 72 hours. Thyroid Function Tests: No results for input(s): TSH, T4TOTAL, FREET4, T3FREE, THYROIDAB in the last 72 hours. Anemia Panel: No results for input(s): VITAMINB12, FOLATE, FERRITIN, TIBC, IRON, RETICCTPCT in the last 72 hours. Urine analysis:    Component Value Date/Time   COLORURINE YELLOW 03/15/2013 0946   APPEARANCEUR CLOUDY (A) 03/15/2013 0946   LABSPEC 1.017 03/15/2013 0946   PHURINE 8.0 03/15/2013 0946   GLUCOSEU NEGATIVE 03/15/2013 0946   HGBUR NEGATIVE 03/15/2013 0946   BILIRUBINUR NEGATIVE 03/15/2013 0946   KETONESUR NEGATIVE 03/15/2013 0946   PROTEINUR NEGATIVE 03/15/2013 0946   UROBILINOGEN 1.0 03/15/2013 0946   NITRITE NEGATIVE 03/15/2013 0946   LEUKOCYTESUR NEGATIVE 03/15/2013 0946    Radiological Exams on Admission: CT Head Wo Contrast  Result Date: 08/08/2019 CLINICAL DATA:  Weakness and gait disturbance developing earlier this  month. Right-sided weakness. Left facial droop. Slurred speech beginning 3 days ago. EXAM: CT HEAD WITHOUT CONTRAST TECHNIQUE: Contiguous axial images were obtained from the base of the skull through the vertex without intravenous contrast. COMPARISON:  11/11/2011 FINDINGS: Brain: Brainstem and cerebellum are unremarkable. There is probably an acute to subacute stroke in the left basal ganglia/posterior limb internal capsule. Old small vessel infarction in the radiating white matter tracts above that. Mild chronic small-vessel change of the cerebral hemispheric white matter. No large vessel territory infarction. No mass lesion, hemorrhage, hydrocephalus or extra-axial collection. Vascular: There is atherosclerotic calcification of the major vessels at the base of the brain. Skull: Negative Sinuses/Orbits: Clear/normal Other: None IMPRESSION: Acute to subacute infarction in the left basal ganglia/posterior limb internal capsule. Older small vessel infarction in the radiating white  matter tracts just above that. No hemorrhage or mass effect. Electronically Signed   By: Nelson Chimes M.D.   On: 08/08/2019 15:03    EKG: Independently reviewed.  Normal sinus rhythm.  No ST elevations or depressions.  Assessment/Plan  Acute to subacute infarction in the left basal ganglia/posterior limb internal capsule -Patient presented with left-sided facial droop and right-sided weakness for the last few days.  She is out of window for [facility] -Neurology consult -Transfer to [facility].  Telemetry monitoring.  MRI of the brain.  2D echo.  Ultrasound carotids. -Aspirin. -Lipid profile and A1c in a.m. -PT/OT/SLP evaluation.  N.p.o. till bedside swallow evaluation/SLP eval -Allow for permissive hypertension  Diabetes mellitus type II with hyperglycemia -CBGs with SSI.  A1c  Hypertension -Allow for permissive hypertension  DVT prophylaxis: Lovenox Code Status: DNR Family Communication: Daughter at bedside Disposition Plan: Will need to be inpatient for 1 to 3 days pending neurology/PT eval Consults called: Neurology/Dr. Aroor Admission status: Inpatient/telemetry  Severity of Illness: The appropriate patient status for this patient is INPATIENT. Inpatient status is judged to be reasonable and necessary in order to provide the required intensity of service to ensure the patient's safety. The patient's presenting symptoms, physical exam findings, and initial radiographic and laboratory data in the context of their chronic comorbidities is felt to place them at high risk for further clinical deterioration. Furthermore, it is not anticipated that the patient will be medically stable for discharge from the hospital within 2 midnights of admission. The following factors support the patient status of inpatient.   " The patient's presenting symptoms include right-sided weakness/facial droop. " The worrisome physical exam findings include left facial droop/right-sided weakness. " The  initial radiographic and laboratory data are worrisome because of acute to subacute infarct on CT of the brain. " The chronic co-morbidities include diabetes mellitus type 2/hypertension.   * I certify that at the point of admission it is my clinical judgment that the patient will require inpatient hospital care spanning beyond 2 midnights from the point of admission due to high intensity of service, high risk for further deterioration and high frequency of surveillance required.Aline August MD [facility]  08/08/2019, 3:48 PM

## 2019-08-08 NOTE — ED Notes (Signed)
Bed alarm placed for pt safety

## 2019-08-08 NOTE — ED Notes (Signed)
Pt transported to radiology.

## 2019-08-08 NOTE — ED Notes (Signed)
Pt transferred to CT.

## 2019-08-08 NOTE — ED Notes (Signed)
Pt passed stroke swallow screen, provider notified so that diet order may be placed

## 2019-08-08 NOTE — ED Triage Notes (Signed)
Daughter of patient Reports on 4/9 patient became weak and  Unable to walk. pcp referred to therapy. Patient now has ride side weakness, facial droop (on left side),  Loss of balance, slurred speech that started Saturday. Daughter reports that she notified pcp on Saturday of the facial droop and pcp advised to "keep eye on patient". Daughter states she was referred here today by therapist to request MRI to rule out brain bleed.

## 2019-08-08 NOTE — ED Notes (Signed)
Ultrasound tech at bedside

## 2019-08-09 ENCOUNTER — Inpatient Hospital Stay (HOSPITAL_BASED_OUTPATIENT_CLINIC_OR_DEPARTMENT_OTHER): Payer: Medicare Other

## 2019-08-09 DIAGNOSIS — I159 Secondary hypertension, unspecified: Secondary | ICD-10-CM | POA: Diagnosis not present

## 2019-08-09 DIAGNOSIS — I361 Nonrheumatic tricuspid (valve) insufficiency: Secondary | ICD-10-CM

## 2019-08-09 DIAGNOSIS — I639 Cerebral infarction, unspecified: Secondary | ICD-10-CM | POA: Diagnosis present

## 2019-08-09 LAB — LIPID PANEL
Cholesterol: 190 mg/dL (ref 0–200)
HDL: 51 mg/dL (ref >40)
LDL Cholesterol: 124 mg/dL — ABNORMAL HIGH (ref 0–99)
Total CHOL/HDL Ratio: 3.7 RATIO
Triglycerides: 74 mg/dL (ref <150)
VLDL: 15 mg/dL (ref 0–40)

## 2019-08-09 LAB — HEMOGLOBIN A1C
Hgb A1c MFr Bld: 6.1 % — ABNORMAL HIGH (ref 4.8–5.6)
Mean Plasma Glucose: 128.37 mg/dL

## 2019-08-09 LAB — ECHOCARDIOGRAM COMPLETE: Height: 62 in

## 2019-08-09 MED ORDER — ASPIRIN EC 81 MG PO TBEC
81.0000 mg | DELAYED_RELEASE_TABLET | Freq: Every day | ORAL | 0 refills | Status: AC
Start: 2019-08-09 — End: 2019-09-08

## 2019-08-09 MED ORDER — OXYCODONE-ACETAMINOPHEN 5-325 MG PO TABS
1.0000 | ORAL_TABLET | Freq: Four times a day (QID) | ORAL | Status: DC | PRN
  Administered 2019-08-09: 1 via ORAL
  Filled 2019-08-09: qty 1

## 2019-08-09 MED ORDER — ATORVASTATIN CALCIUM 40 MG PO TABS
40.0000 mg | ORAL_TABLET | Freq: Every day | ORAL | 0 refills | Status: AC
Start: 2019-08-09 — End: 2019-09-08

## 2019-08-09 NOTE — ED Notes (Signed)
Per day shift RN, patient is going home. Paperwork ready for PTAR. Patient stable.

## 2019-08-09 NOTE — Care Management Obs Status (Signed)
[facility] NOTIFICATION   Patient Details  Name: Carol Poole MRN: [id number] Date of Birth: 03-07-1918   Medicare Observation Status Notification Given:   yes    Erenest Rasher, RN 08/09/2019, 6:37 PM

## 2019-08-09 NOTE — Progress Notes (Signed)
  Echocardiogram 2D Echocardiogram has been performed.  Carol Poole 08/09/2019, 11:04 AM

## 2019-08-09 NOTE — Progress Notes (Signed)
CSW received a call from pt's RN stating that per the provider and the [facility], pt needs her PT follow up paperwork in the pt's hands prior to D/C.  CSW updated the [facility] ED TOC RN CM who will address this need with the pt.  CSW will continue to follow for D/C needs.  Alphonse Guild. Keyandra Swenson  MSW, LCSW, LCAS, CSI Transitions of Care Clinical Social Worker Care Coordination Department Ph: [phone]

## 2019-08-09 NOTE — Discharge Summary (Addendum)
Physician Discharge Summary  Carol Poole [id number] DOB: 1918-03-14 DOA: 08/08/2019  PCP: Jonathon Jordan, MD  Admit date: 08/08/2019 Discharge date: 08/09/2019  Admitted From: Home Discharge disposition: Home with PT/OT   Code Status: DNR  Diet Recommendation: Cardiac diet   Recommendations for Outpatient Follow-Up:   1. Follow-up with neurology as an outpatient 2. Follow-up with PCP  Discharge Diagnosis:   Principal Problem:   CVA (cerebral vascular accident) [facility]) Active Problems:   Hypertension   Type 2 diabetes mellitus without complication ([place])   Stroke ([place])  History of Present Illness / Brief narrative:  Carol Poole is a 86 y.o. female with PMH of insulin-dependent diabetes mellitus, hypertension, hyperlipidemia, CKD, bilateral blindness. Patient presented to the ED on 08/08/2019 with complaint of 3-week history of impaired gait, right-sided weakness and confusion.  Per family, 3 weeks ago, patient was first noticed to have gait impairment.  She went to her PCP who took her off multiple medications after which patient started doing better.  Last week, she was noted to have more weakness on the right side also with slurred speech.  Patient refused to be brought to the hospital at that time.  Her deficits partially improved but for last 2 to 3 days, patient has been more declining physically and also getting increasingly confused.  Patient had a fall on 4/27 and was hence brought to the ED.  In the ED, patient was noted to be weak on the right side. CT of the head showed acute to subacute infarction in the left basal ganglia/posterior limb internal capsule.  Neurology was consultedby the ED provider. Patient was admitted to hospitalist service for further evaluation and management. See below for details.  Hospital Course:  Acute to subacute infarction in the left basal ganglia/posterior limb internal capsule -Patient presented with left-sided facial droop  and right-sided weakness for the last few days. She is out of window for [facility] -Neurology consult appreciated. -MRI brain confirmed CT findings..  -Carotid arteries showed nonsignificant stenosis on both sides. -PT/OT/ST evaluation were obtained.  Home health PT/OT recommended. -Aspirin 81 mg daily, Lipitor 40 mg daily ordered per neurology recommendation -Per neurology, no need of dual antiplatelet as patient is already a week from stroke.  Right vestibular schwannoma -Noted in MRI brain. No mass-effect upon the brain. -No intervention at this time.  Diabetes mellitus type II with hyperglycemia -A1c 6.1 on 4/28 -Continue home insulin regimen.  Dyslipidemia -LDL 124 -Started on Lipitor.  Elevated blood pressure -Blood pressure mostly normal with some elevation to 130s. -Not on home medicines.  Code Status: DNR. Okay to discharge to home with home health, PT, OT.   Referral also given for outpatient palliative care services.  Subjective:  Patient was seen and examined this morning.  Pleasant, elderly African-American female.  Not in physical distress.  Oriented to place and person.  Patient's daughter is at bedside.  Discharge Exam:   Vitals:   08/09/19 0915 08/09/19 1000 08/09/19 1630 08/09/19 1700  BP:  (!) 130/58 129/60 133/67  Pulse: 73 91 72 77  Resp: (!) 29 14 18 18   Temp:      TempSrc:      SpO2: 96% 99% 98% 100%  Height:        Body mass index is 32.01 kg/m.  General exam: Appears calm and comfortable.  Skin: No rashes, lesions or ulcers. HEENT: Atraumatic, normocephalic, supple neck, no obvious bleeding Lungs: Clear to auscultation bilaterally CVS: Regular rate and rhythm, no murmur GI/Abd  soft, nontender, nondistended, bowel sound present CNS: Alert, awake, oriented to place and person.  Weak on the right side.  Not restless or agitated Psychiatry: Mood appropriate Extremities: No pedal edema, no calf tenderness  Discharge Instructions:  Wound  care: None Discharge Instructions    Amb Referral to Palliative Care   Complete by: As directed    Increase activity slowly   Complete by: As directed      Follow-up Information    Jonathon Jordan, MD Follow up.   Specialty: Family Medicine Contact information: [place] [address] [place] Follow up.   Contact information: [address]     [place] [phone]         Allergies as of 08/09/2019      Reactions   Other Other (See Comments)   PT is a Fara Boros witness per daughter. NO Blood products.    Corticosteroids Other (See Comments)   Hearing loss.       Medication List    TAKE these medications   aspirin EC 81 MG tablet Take 1 tablet (81 mg total) by mouth daily.   atorvastatin 40 MG tablet Commonly known as: Lipitor Take 1 tablet (40 mg total) by mouth daily.   CENTRUM SILVER PO Take 1 tablet by mouth daily.   cephALEXin 500 MG capsule Commonly known as: KEFLEX Take 500 mg by mouth every 12 (twelve) hours.   Levemir FlexPen 100 UNIT/ML FlexPen Generic drug: insulin detemir Inject 20 Units into the skin as needed (when sugar is over 200).   MIRALAX PO Take 1 Dose by mouth as needed (constipation).   oxyCODONE-acetaminophen 5-325 MG tablet Commonly known as: PERCOCET/ROXICET Take 0.5-1 tablets by mouth every 6 (six) hours as needed for pain. What changed:   how much to take  reasons to take this   prednisoLONE acetate 1 % ophthalmic suspension Commonly known as: PRED FORTE Place 1 drop into both eyes 2 (two) times daily.   VITAMIN D PO Take 1 tablet by mouth every Monday, Wednesday, and Friday.       Time coordinating discharge: 35 minutes  The results of significant diagnostics from this hospitalization (including imaging, microbiology, ancillary and laboratory) are listed below for reference.    Procedures and  Diagnostic Studies:   DG Chest 2 View  Result Date: 08/08/2019 CLINICAL DATA:  Weakness EXAM: CHEST - 2 VIEW COMPARISON:  April 28, 2013 FINDINGS: There is airspace consolidation in the left lower lobe with small left pleural effusion. Lungs elsewhere are clear. Heart size and pulmonary vascular normal. There is a hiatal hernia. There is aortic atherosclerosis as well as calcification in the right subclavian artery. There is degenerative change in each shoulder. IMPRESSION: Airspace consolidation left lower lobe, likely due to pneumonia, with small left pleural effusion. Lungs elsewhere clear. Heart size normal. Hiatal hernia present. Aortic Atherosclerosis (ICD10-I70.0). Electronically Signed   By: Lowella Grip III M.D.   On: 08/08/2019 18:11   CT Head Wo Contrast  Result Date: 08/08/2019 CLINICAL DATA:  Weakness and gait disturbance developing earlier this month. Right-sided weakness. Left facial droop. Slurred speech beginning 3 days ago. EXAM: CT HEAD WITHOUT CONTRAST TECHNIQUE: Contiguous axial images were obtained from the base of the skull through the vertex without intravenous contrast. COMPARISON:  11/11/2011 FINDINGS: Brain: Brainstem and cerebellum are unremarkable. There is probably an acute to subacute stroke in the left  basal ganglia/posterior limb internal capsule. Old small vessel infarction in the radiating white matter tracts above that. Mild chronic small-vessel change of the cerebral hemispheric white matter. No large vessel territory infarction. No mass lesion, hemorrhage, hydrocephalus or extra-axial collection. Vascular: There is atherosclerotic calcification of the major vessels at the base of the brain. Skull: Negative Sinuses/Orbits: Clear/normal Other: None IMPRESSION: Acute to subacute infarction in the left basal ganglia/posterior limb internal capsule. Older small vessel infarction in the radiating white matter tracts just above that. No hemorrhage or mass effect.  Electronically Signed   By: Nelson Chimes M.D.   On: 08/08/2019 15:03   MR BRAIN WO CONTRAST  Result Date: 08/08/2019 CLINICAL DATA:  Mental status decline beginning a few weeks ago. Ataxia. EXAM: MRI HEAD WITHOUT CONTRAST TECHNIQUE: Multiplanar, multiecho pulse sequences of the brain and surrounding structures were obtained without intravenous contrast. COMPARISON:  Head CT same day FINDINGS: Brain: Diffusion imaging shows old infarction in the left basal ganglia/posterior limb internal capsule with some T2 shine through of the surrounding brain. I doubt that there is an acute infarction in this location. Elsewhere, there chronic small-vessel changes of the pons. No focal cerebellar insult. Mild chronic small-vessel change elsewhere affecting the cerebral hemispheric white matter. No intra-axial mass, hemorrhage, hydrocephalus or extra-axial collection. 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 Vascular: Major vessels at the base of the brain show flow. Skull and upper cervical spine: Negative Sinuses/Orbits: Chronic atrophic changes of the left globe. Sinuses clear. Other: None IMPRESSION: MRI scan suggests that the left basal ganglia/posterior limb internal capsule infarction is old. There is some T2 shine through signal but I doubt the presence of actual infarction along the margins of that old infarction. Elsewhere, the brain shows only minimal small vessel ischemic change of the white matter. 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 No mass-effect upon the brain. Electronically Signed   By: Nelson Chimes M.D.   On: 08/08/2019 17:32   ECHOCARDIOGRAM COMPLETE  Result Date: 08/09/2019    ECHOCARDIOGRAM REPORT   Patient Name:   DANIJAH AVE Date of Exam: 08/09/2019 Medical Rec #:  [id number]      Height:       62.0 in Accession #:    [id number]     Weight:       175.0 lb Date of Birth:  1917/05/27       BSA:          1.806 m Patient Age:    49 years       BP:           130/58 mmHg Patient Gender: F              HR:           91 bpm. Exam Location:  Inpatient Procedure: 2D Echo Indications:    434.91 stroke  History:        Patient has prior history of Echocardiogram examinations, most                 recent 11/12/2011. Risk Factors:Hypertension, Diabetes and                 Dyslipidemia.  Sonographer:    Jannett Celestine RDCS (AE) Referring Phys: [id number] [facility]  Sonographer Comments: very limited mobility. off axis apical windows IMPRESSIONS  1. Left ventricular ejection fraction, by estimation, is 60 to  65%. The left ventricle has normal function. The left ventricle has no regional wall motion abnormalities. There is severe left ventricular hypertrophy of the basal-septal segment and moderate concentric LVH.  2. RVSP cannot be accurately assessed as IVC is not adequately visualized. There is at least a PASP of 76mmHg based on TR jet itself. . Right ventricular systolic function is mildly reduced. The right ventricular size is normal.  3. The mitral valve is normal in structure. Mild mitral valve regurgitation. No evidence of mitral stenosis.  4. There appears to be some degree of aortic stenosis but doppler assessment of the aortic valve was not performed. The aortic valve is tricuspid. Aortic valve regurgitation is not visualized. No aortic stenosis is present.  5. Tricuspid valve regurgitation is mild to moderate.  6. Recommend limited study to get doppler assessment of aortic valve to determine if significant AS is present. FINDINGS  Left Ventricle: Left ventricular ejection fraction, by estimation, is 60 to 65%. The left ventricle has normal function. The left ventricle has no regional wall motion abnormalities. The left ventricular internal cavity size was normal in size. There is  severe left ventricular hypertrophy of the basal-septal segment and moderate concentric hypertrophy. Unable to assess LV  diastolic filling due to underlying atrial fibrillation. Right Ventricle: RVSP cannot be accurately assessed as IVC is not adequately visualized. There is at least a PASP of 70mmHg based on TR jet itself. The right ventricular size is normal. No increase in right ventricular wall thickness. Right ventricular systolic function is mildly reduced. Left Atrium: Left atrial size was normal in size. Right Atrium: Right atrial size was normal in size. Pericardium: A small pericardial effusion is present. The pericardial effusion is circumferential. Mitral Valve: The mitral valve is normal in structure. Normal mobility of the mitral valve leaflets. Mild mitral valve regurgitation. No evidence of mitral valve stenosis. Tricuspid Valve: The tricuspid valve is normal in structure. Tricuspid valve regurgitation is mild to moderate. No evidence of tricuspid stenosis. Aortic Valve: There appears to be some degree of aortic stenosis but doppler assessment of the aortic valve was not performed. The aortic valve is tricuspid. . There is severe thickening and severe calcifcation of the aortic valve. Aortic valve regurgitation is not visualized. No aortic stenosis is present. There is severe thickening of the aortic valve. There is severe calcifcation of the aortic valve. Pulmonic Valve: The pulmonic valve was normal in structure. Pulmonic valve regurgitation is not visualized. No evidence of pulmonic stenosis. Aorta: The aortic root is normal in size and structure. Venous: The inferior vena cava was not well visualized. IAS/Shunts: No atrial level shunt detected by color flow Doppler.  LEFT VENTRICLE PLAX 2D LVIDd:         3.76 cm LVIDs:         2.92 cm LV PW:         1.35 cm LV IVS:        1.73 cm LVOT diam:     1.90 cm LV SV:         35 LV SV Index:   19 LVOT Area:     2.84 cm  LEFT ATRIUM         Index LA diam:    3.90 cm 2.16 cm/m  AORTIC VALVE LVOT Vmax:   64.30 cm/s LVOT Vmean:  46.900 cm/s LVOT VTI:    0.123 m  AORTA Ao Root  diam: 3.60 cm TRICUSPID VALVE TR Peak grad:   87.2 mmHg TR Vmax:  467.00 cm/s  SHUNTS Systemic VTI:  0.12 m Systemic Diam: 1.90 cm Fransico Him MD Electronically signed by Fransico Him MD Signature Date/Time: 08/09/2019/2:01:41 PM    Final    VAS US CAROTID (at [facility] and [facility] only)  Result Date: 08/09/2019 Carotid Arterial Duplex Study Indications:       CVA. Limitations        Today's exam was limited due to the high bifurcation of the                    carotid, the patient's respiratory variation and Vessel                    tortuosity. Comparison Study:  No prior studies. Performing Technologist: Oliver Hum RVT  Examination Guidelines: A complete evaluation includes B-mode imaging, spectral Doppler, color Doppler, and power Doppler as needed of all accessible portions of each vessel. Bilateral testing is considered an integral part of a complete examination. Limited examinations for reoccurring indications may be performed as noted.  Right Carotid Findings: +----------+-------+-------+--------+---------------------------------+--------+             PSV     EDV     Stenosis Plaque Description                Comments              cm/s    cm/s                                                         +----------+-------+-------+--------+---------------------------------+--------+  CCA Prox   44      6                smooth and heterogenous                     +----------+-------+-------+--------+---------------------------------+--------+  CCA Distal 59      12               smooth and heterogenous                     +----------+-------+-------+--------+---------------------------------+--------+  ICA Prox   86      15               irregular, heterogenous and                                                      calcific                                    +----------+-------+-------+--------+---------------------------------+--------+  ICA Distal 67      19                                                  tortuous  +----------+-------+-------+--------+---------------------------------+--------+  ECA        128     11                                                           +----------+-------+-------+--------+---------------------------------+--------+ +----------+--------+-------+--------+-------------------+  PSV cm/s EDV cms Describe Arm Pressure (mmHG)  +----------+--------+-------+--------+-------------------+  Subclavian 54                                             +----------+--------+-------+--------+-------------------+ +---------+--------+--+--------+--+---------+  Vertebral PSV cm/s 85 EDV cm/s 17 Antegrade  +---------+--------+--+--------+--+---------+  Left Carotid Findings: +----------+--------+--------+--------+--------------------------+--------+             PSV cm/s EDV cm/s Stenosis Plaque Description         Comments  +----------+--------+--------+--------+--------------------------+--------+  CCA Prox   56       7                 irregular and heterogenous tortuous  +----------+--------+--------+--------+--------------------------+--------+  CCA Distal 54       7                 smooth and heterogenous              +----------+--------+--------+--------+--------------------------+--------+  ICA Prox   74       20                smooth and heterogenous    tortuous  +----------+--------+--------+--------+--------------------------+--------+  ICA Distal 54       17                                                     +----------+--------+--------+--------+--------------------------+--------+  ECA        88       0                                                      +----------+--------+--------+--------+--------------------------+--------+ +----------+--------+--------+--------+-------------------+             PSV cm/s EDV cm/s Describe Arm Pressure (mmHG)  +----------+--------+--------+--------+-------------------+  Subclavian 106                                              +----------+--------+--------+--------+-------------------+ +---------+--------+--+--------+-+---------+  Vertebral PSV cm/s 20 EDV cm/s 2 Antegrade  +---------+--------+--+--------+-+---------+   Summary: Right Carotid: Velocities in the right ICA are consistent with a 1-39% stenosis. Left Carotid: Velocities in the left ICA are consistent with a 1-39% stenosis. Vertebrals: Bilateral vertebral arteries demonstrate antegrade flow. *See table(s) above for measurements and observations.  Electronically signed by Antony Contras MD on 08/09/2019 at 6:49:33 AM.    Final      Labs:   Basic Metabolic Panel: Recent Labs  Lab 08/08/19 1246  NA 134*  K 4.0  CL 101  CO2 22  GLUCOSE 192*  BUN 24*  CREATININE 0.88  CALCIUM 8.7*   GFR CrCl cannot be calculated (Unknown ideal weight.). Liver Function Tests: No results for input(s): AST, ALT, ALKPHOS, BILITOT, PROT, ALBUMIN in the last 168 hours. No results for input(s): LIPASE, AMYLASE in the last 168 hours. No results for input(s): AMMONIA in the last 168 hours. Coagulation profile No results for input(s): INR, PROTIME in the last  168 hours.  CBC: Recent Labs  Lab 08/08/19 1246  WBC 8.4  HGB 11.6*  HCT 35.6*  MCV 100.6*  PLT 236   Cardiac Enzymes: No results for input(s): CKTOTAL, CKMB, CKMBINDEX, TROPONINI in the last 168 hours. BNP: Invalid input(s): POCBNP CBG: Recent Labs  Lab 08/08/19 1419  GLUCAP 181*   D-Dimer No results for input(s): DDIMER in the last 72 hours. Hgb A1c Recent Labs    08/09/19 0835  HGBA1C 6.1*   Lipid Profile Recent Labs    08/09/19 0835  CHOL 190  HDL 51  LDLCALC 124*  TRIG 74  CHOLHDL 3.7   Thyroid function studies No results for input(s): TSH, T4TOTAL, T3FREE, THYROIDAB in the last 72 hours.  Invalid input(s): FREET3 Anemia work up No results for input(s): VITAMINB12, FOLATE, FERRITIN, TIBC, IRON, RETICCTPCT in the last 72 hours. Microbiology Recent Results (from the past 240  hour(s))  Respiratory Panel by RT PCR (Flu A&B, Covid) - Urine, Clean Catch     Status: None   Collection Time: 08/08/19  6:43 PM   Specimen: Urine, Clean Catch  Result Value Ref Range Status   SARS Coronavirus 2 by RT PCR NEGATIVE NEGATIVE Final    Comment: (NOTE) SARS-CoV-2 target nucleic acids are NOT DETECTED. The SARS-CoV-2 RNA is generally detectable in upper respiratoy specimens during the acute phase of infection. The lowest concentration of SARS-CoV-2 viral copies this assay can detect is 131 copies/mL. A negative result does not preclude SARS-Cov-2 infection and should not be used as the sole basis for treatment or other patient management decisions. A negative result may occur with  improper specimen collection/handling, submission of specimen other than nasopharyngeal swab, presence of viral mutation(s) within the areas targeted by this assay, and inadequate number of viral copies (<131 copies/mL). A negative result must be combined with clinical observations, patient history, and epidemiological information. The expected result is Negative. Fact Sheet for Patients:  PinkCheek.be Fact Sheet for Healthcare Providers:  GravelBags.it This test is not yet ap proved or cleared by the [place] FDA and  has been authorized for detection and/or diagnosis of SARS-CoV-2 by FDA under an Emergency Use Authorization (EUA). This EUA will remain  in effect (meaning this test can be used) for the duration of the COVID-19 declaration under Section 564(b)(1) of the Act, 21 U.S.C. section 360bbb-3(b)(1), unless the authorization is terminated or revoked sooner.    Influenza A by PCR NEGATIVE NEGATIVE Final   Influenza B by PCR NEGATIVE NEGATIVE Final    Comment: (NOTE) The Xpert Xpress SARS-CoV-2/FLU/RSV assay is intended as an aid in  the diagnosis of influenza from Nasopharyngeal swab specimens and  should not be used as a  sole basis for treatment. Nasal washings and  aspirates are unacceptable for Xpert Xpress SARS-CoV-2/FLU/RSV  testing. Fact Sheet for Patients: PinkCheek.be Fact Sheet for Healthcare Providers: GravelBags.it This test is not yet approved or cleared by the [place] FDA and  has been authorized for detection and/or diagnosis of SARS-CoV-2 by  FDA under an Emergency Use Authorization (EUA). This EUA will remain  in effect (meaning this test can be used) for the duration of the  Covid-19 declaration under Section 564(b)(1) of the Act, 21  U.S.C. section 360bbb-3(b)(1), unless the authorization is  terminated or revoked. Performed at [facility], [place] [address], [place],  [address]     Please note: You were cared for by a hospitalist during your hospital stay. Once you are discharged, your  primary care physician will handle any further medical issues. Please note that NO REFILLS for any discharge medications will be authorized once you are discharged, as it is imperative that you return to your primary care physician (or establish a relationship with a primary care physician if you do not have one) for your post hospital discharge needs so that they can reassess your need for medications and monitor your lab values.  Signed: Terrilee Croak  [facility] 08/09/2019, 6:24 PM

## 2019-08-09 NOTE — Progress Notes (Deleted)
CSW received a call from [facility] stating pt needs a CODE 44 as pt's status is changing to observation.  CSW will continue to follow for D/C needs.  Alphonse Guild. Dorothymae Maciver  MSW, LCSW, LCAS, CSI Transitions of Care Clinical Social Worker Care Coordination Department Ph: [phone]

## 2019-08-09 NOTE — Care Management CC44 (Signed)
Condition Code 44 Documentation Completed  Patient Details  Name: Novalee Streeval MRN: [id number] Date of Birth: 1917-07-05   Condition Code 44 given:  Yes Patient signature on Condition Code 44 notice:  Yes Documentation of 2 MD's agreement:  Yes Code 44 added to claim:  Yes    Erenest Rasher, RN 08/09/2019, 6:37 PM

## 2019-08-09 NOTE — Progress Notes (Addendum)
CSW received a call from [facility] stating pt needs a CODE 44 as pt's status is changing to observation.  [facility] ED TOC RN CM updated and will address the CODE 44.  CSW will continue to follow for D/C needs.  Carol Poole. Jrue Yambao  MSW, LCSW, LCAS, CSI Transitions of Care Clinical Social Worker Care Coordination Department Ph: [phone]

## 2019-08-09 NOTE — Progress Notes (Signed)
PROGRESS NOTE  Carol Poole  DOB: [birth year]  PCP: Jonathon Jordan, MD [id number]  DOA: 08/08/2019  LOS: 1 day   Chief Complaint  Patient presents with  . Weakness   Brief narrative: Carol Poole is a 86 y.o. female with PMH of insulin-dependent diabetes mellitus, hypertension, hyperlipidemia, CKD, bilateral blindness. Patient presented to the ED on 08/08/2019 with complaint of 3-week history of impaired gait, right-sided weakness and confusion.  Per family, 3 weeks ago, patient was first noticed to have gait impairment.  She went to her PCP who took her off multiple medications after which patient started doing better.  Last week, she was noted to have more weakness on the right side also with slurred speech.  Patient refused to be brought to the hospital at that time.  Her deficits partially improved but for last 2 to 3 days, patient has been more declining physically and also getting increasingly confused.  Patient had a fall on 4/27 and was hence brought to the ED.  In the ED, patient was noted to be weak on the right side. CT of the head showed acute to subacute infarction in the left basal ganglia/posterior limb internal capsule.   Neurology was consulted by the ED provider. Patient was admitted to hospitalist service for further evaluation and management. See below for details.  Subjective: Patient was seen and examined this morning.  Pleasant, elderly African-American female.  Not in physical distress.  Oriented to place and person.  Patient's daughter is at bedside.  Assessment/Plan: Acute to subacute infarction in the left basal ganglia/posterior limb internal capsule -Patient presented with left-sided facial droop and right-sided weakness for the last few days.  She is out of window for [facility] -Neurology consult appreciated. -MRI brain confirmed stroke.  -Carotid arteries showed nonsignificant stenosis on both sides. -PT/OT/ST evaluation were obtained. -Aspirin 81 mg  daily, Lipitor 40 mg daily -Per neurology, no need of dual antiplatelet as patient is already a week from stroke.  Right vestibular schwannoma -Noted in MRI brain.  No mass-effect upon the brain. -No intervention at this time.  Diabetes mellitus type II with hyperglycemia -A1c 6.1 on 4/28 -I noticed Levemir 20 units in her home list but only as as needed. -Continue to monitor blood sugar.  Dyslipidemia -LDL 124  Elevated blood pressure -Blood pressure mostly normal with some elevation to 130s. -Not on home medicines.  Mobility: PT eval pending Code Status:  DNR  DVT prophylaxis:  Lovenox subcu Antimicrobials:  None Fluid: None Diet: Cardiac/diabetic diet  Consultants: Neurology Family Communication:  Spoke with daughter at bedside  Status is: Inpatient Pending PT evaluation.  We may be able to discharge the patient from home today if no recommendation of rehab or SNF.  Dispo: The patient is from: Home              Anticipated d/c is to: Home hopefully              Anticipated d/c date is: Hopefully today          Antimicrobials: Anti-infectives (From admission, onward)   None        Code Status: DNR   Diet Order            Diet heart healthy/carb modified Room service appropriate? Yes; Fluid consistency: Thin  Diet effective now              Infusions:    Scheduled Meds: . aspirin  300 mg Rectal Daily   Or  .  aspirin  325 mg Oral Daily  . enoxaparin (LOVENOX) injection  40 mg Subcutaneous Q24H  . prednisoLONE acetate  1 drop Both Eyes BID  . sodium chloride flush  3 mL Intravenous Once    PRN meds: acetaminophen **OR** acetaminophen (TYLENOL) oral liquid 160 mg/5 mL **OR** acetaminophen, oxyCODONE-acetaminophen, senna-docusate   Objective: Vitals:   08/09/19 0915 08/09/19 1000  BP:  (!) 130/58  Pulse: 73 91  Resp: (!) 29 14  Temp:    SpO2: 96% 99%   No intake or output data in the 24 hours ending 08/09/19 1201 There were no vitals filed  for this visit. Weight change:  Body mass index is 32.01 kg/m.   Physical Exam: General exam: Appears calm and comfortable.  Not in physical distress Skin: No rashes, lesions or ulcers. HEENT: Atraumatic, normocephalic, supple neck, no obvious bleeding Lungs: Clear to auscultation bilaterally CVS: Regular rate and rhythm, no murmur GI/Abd soft, nontender, nondistended, bowel sound present CNS: Alert, awake, oriented to place and person, slow to respond.  Mild weakness on right hand grip Psychiatry: Mood appropriate Extremities: No pedal edema, no calf tenderness  Data Review: I have personally reviewed the laboratory data and studies available.  CBC: Recent Labs  Lab 08/08/19 1246  WBC 8.4  HGB 11.6*  HCT 35.6*  MCV 100.6*  PLT [id number]    Basic Metabolic Panel: Recent Labs  Lab 08/08/19 1246  NA 134*  K 4.0  CL 101  CO2 22  GLUCOSE 192*  BUN 24*  CREATININE 0.88  CALCIUM 8.7*    Liver Function Tests: No results for input(s): AST, ALT, ALKPHOS, BILITOT, PROT, ALBUMIN in the last 168 hours. No results for input(s): LIPASE, AMYLASE in the last 168 hours. No results for input(s): AMMONIA in the last 168 hours.  Cardiac Enzymes: No results for input(s): CKTOTAL, CKMB, CKMBINDEX, TROPONINI in the last 168 hours.  BNP (last 3 results) No results for input(s): BNP in the last 8760 hours.  ProBNP (last 3 results) No results for input(s): PROBNP in the last 8760 hours.  CBG: Recent Labs  Lab 08/08/19 1419  GLUCAP 181*    Lipase  No results found for: LIPASE   Urinalysis    Component Value Date/Time   COLORURINE YELLOW 08/08/2019 1843   APPEARANCEUR CLEAR 08/08/2019 1843   LABSPEC 1.014 08/08/2019 1843   PHURINE 5.0 08/08/2019 1843   GLUCOSEU NEGATIVE 08/08/2019 1843   HGBUR NEGATIVE 08/08/2019 1843   BILIRUBINUR NEGATIVE 08/08/2019 1843   KETONESUR NEGATIVE 08/08/2019 1843   PROTEINUR NEGATIVE 08/08/2019 1843   UROBILINOGEN 1.0 03/15/2013 0946    NITRITE NEGATIVE 08/08/2019 1843   LEUKOCYTESUR NEGATIVE 08/08/2019 1843     Drugs of Abuse  No results found for: LABOPIA, COCAINSCRNUR, LABBENZ, AMPHETMU, THCU, LABBARB    Signed, Terrilee Croak, MD [facility] Pager: [phone] (Secure Chat preferred). 08/09/2019

## 2019-08-09 NOTE — Care Management Obs Status (Signed)
[place] NOTIFICATION   Patient Details  Name: Carol Poole MRN: [id number] Date of Birth: 09/17/1917   Medicare Observation Status Notification Given:  Yes    Erenest Rasher, RN 08/09/2019, 6:37 PM

## 2019-08-09 NOTE — Progress Notes (Signed)
PT Cancellation Note  Patient Details Name: Carol Poole MRN: [id number] DOB: 1917-07-01   Cancelled Treatment:    Reason Eval/Treat Not Completed: Other (comment)patient admitted with Stroke. PT eval to follow tomorrw.   Tresa Endo [place] Pager [phone] Office [phone]  08/09/2019, 2:50 PM

## 2019-08-09 NOTE — Evaluation (Signed)
Physical Therapy Evaluation Patient Details Name: Carol Poole: [id number] DOB: 12-22-1917 Today's Date: 08/09/2019   History of Present Illness  Carol Poole is a 86 y.o. female with PMH of insulin-dependent diabetes mellitus, hypertension, hyperlipidemia, CKD, bilateral blindness. presented to the ED on 08/08/2019 with complaint of 3-week history of impaired gait, right-sided weakness and confusion and fall at home.CT of the head showed acute to subacute infarction in the left basal ganglia/posterior limb internal capsule  Clinical Impression  Patient evaluated by Physical Therapy with no further acute PT needs identified. All education has been completed and the patient has no further questions.  Pt is alert and cooperative, eating dinner. Family present and supportive. Recommend HHPT, HHOT and 24 hr assist which family state they can provide. Pt to go to second level bedroom and family state they have plenty of help to carry Carol Poole upstairs, as well as the 3 steps to get inside. Pt has all necessary DME. They decline hospital bed, discussed placing bed against wall with temporary bed rail on opposite side. Pt dtr has baby monitor already in place as an added safety measure.   See below for any follow-up Physical Therapy or equipment needs. PT is signing off. Thank you for this referral.     Follow Up Recommendations Home health PT;Supervision/Assistance - 24 hour(HHOT)    Equipment Recommendations  None recommended by PT(pt has all needed DME)    Recommendations for Other Services       Precautions / Restrictions Precautions Precautions: Fall Restrictions Weight Bearing Restrictions: No      Mobility  Bed Mobility Overal bed mobility: Needs Assistance             General bed mobility comments: min assist for partial roll  Transfers                 General transfer comment: deferred as pt eating diner adn family ready to d/c as soon  as  possible  Ambulation/Gait                Stairs            Wheelchair Mobility    Modified Rankin (Stroke Patients Only)       Balance Overall balance assessment: History of Falls             Standing balance comment: requiring UE and external assist prior to ED admission (since CVA) per family                             Pertinent Vitals/Pain Pain Assessment: Faces Faces Pain Scale: Hurts little more Pain Location: left foot Pain Descriptors / Indicators: Grimacing;Sore Pain Intervention(s): Monitored during session;Limited activity within patient's tolerance    Home Living Family/patient expects to be discharged to:: Private residence Living Arrangements: Children Available Help at Discharge: Family;Personal care attendant Type of Home: House Home Access: Stairs to enter   [employer] of Steps: 3 Home Layout: Two level Home Equipment: [place] - 4 wheels;Bedside commode;Wheelchair - Banker;Shower seat Additional Comments: family planning to take pt home,  they have lots of family support  and can lift/assist her up the steps    Prior Function Level of Independence: Needs assistance   Gait / Transfers Assistance Needed: pt was living alone and independent prior to CVA, getting HHPT since CVA and working on amb with assist/RW  ADL's / Homemaking Assistance Needed: will have assist prn  Hand Dominance        Extremity/Trunk Assessment   Upper Extremity Assessment Upper Extremity Assessment: Generalized weakness    Lower Extremity Assessment Lower Extremity Assessment: Generalized weakness;RLE deficits/detail;LLE deficits/detail RLE Deficits / Details: df 2/5, pf 2/5. knee and hip 2-/5 RLE Sensation: decreased light touch RLE Coordination: decreased fine motor;decreased gross motor LLE Deficits / Details: ankle WFL, knee and hip 2/5 LLE Sensation: decreased light touch LLE Coordination: decreased  gross motor       Communication   Communication: No difficulties  Cognition Arousal/Alertness: Awake/alert Behavior During Therapy: WFL for tasks assessed/performed Overall Cognitive Status: Within Functional Limits for tasks assessed                                 General Comments: pt is alert and oriented at time of eval. no difficulties with swallowing noted --family feeding her while PT in room. dysarthria resolving today per pt dtr      General Comments      Exercises     Assessment/Plan    PT Assessment All further PT needs can be met in the next venue of care  PT Problem List         PT Treatment Interventions      PT Goals (Current goals can be found in the Care Plan section)  Acute Rehab PT Goals Patient Stated Goal: to go home PT Goal Formulation: All assessment and education complete, DC therapy    Frequency     Barriers to discharge        Co-evaluation               AM-PAC PT "6 Clicks" Mobility  Outcome Measure Help needed turning from your back to your side while in a flat bed without using bedrails?: A Little Help needed moving from lying on your back to sitting on the side of a flat bed without using bedrails?: A Lot Help needed moving to and from a bed to a chair (including a wheelchair)?: A Lot Help needed standing up from a chair using your arms (e.g., wheelchair or bedside chair)?: A Lot Help needed to walk in hospital room?: A Lot Help needed climbing 3-5 steps with a railing? : Total 6 Click Score: 12    End of Session   Activity Tolerance: Patient tolerated treatment well Patient left: in bed;with call bell/phone within reach;with family/visitor present   PT Visit Diagnosis: Other symptoms and signs involving the nervous system (R29.898);Hemiplegia and hemiparesis Hemiplegia - Right/Left: Right Hemiplegia - caused by: Cerebral infarction    Time: 1650-1711 PT Time Calculation (min) (ACUTE ONLY): 21  min   Charges:   PT Evaluation $PT Eval Low Complexity: 1 Low          Selin Eisler, PT   Acute Rehab Dept [facility]): [phone]   08/09/2019   [facility] 08/09/2019, 5:18 PM

## 2019-08-09 NOTE — Plan of Care (Signed)
Reviewed echocardiogram-no thrombus, significant dilated LA. she does have significant LVH. Reviewed carotid Dopplers-no significant stenosis LDL 124-needs high-dose statin Continue aspirin daily  Outpatient stroke follow-up in 2 to 4 weeks

## 2019-08-09 NOTE — Progress Notes (Addendum)
TOC CM spoke to dtr at bedside. She is moving in with her after dc. Pt active with [facility]. Contacted [facility] rep, Amy Hyatt with resumption of care. Pt has RW, bedside commode, transport chair, and shower chair. PTAR arranged. [place], [place] ED TOC CM [phone]

## 2019-08-11 NOTE — Progress Notes (Signed)
Late entry 08/10/2019 Referral for Palliative faxed to [place]. Dtr, Jacklynn Bue offered choice and agreeable to services with [facility]. Jonnie Finner RN CCM, [facility] ED TOC Jearld Lesch [phone]  08/11/2019 1:48 pm Contacted [facility] Coordinator for Palliative services. Referral was received. [place], [place] ED TOC CM [phone]

## 2019-08-15 ENCOUNTER — Telehealth: Payer: Self-pay | Admitting: Internal Medicine

## 2019-08-15 NOTE — Telephone Encounter (Signed)
[facility] Palliative visit scheduled for 08-28-19 at 11:00.

## 2019-08-25 ENCOUNTER — Telehealth: Payer: Self-pay | Admitting: Internal Medicine

## 2019-08-27 NOTE — Progress Notes (Signed)
May 17th, 2021 [facility] Palliative Care Consult Note Telephone: [phone]  Fax: [phone]  PATIENT NAME: Carol Poole DOB: 24-Feb-1918 MRN: [id number]  [place], [place]  PRIMARY CARE PROVIDER:   Jonathon Jordan, MD  REFERRING PROVIDER:  Jonathon Jordan, MD [address],  [place] [address]   [place], Karena Addison (Neurology)  RESPONSIBLE PARTY: Lucy [place]) Jacklynn Bue [phone], [phone] (Home Phone)  ASSESSMENT / RECOMMENDATIONS:  1. Advance Care Planning: A. Directives: Completed DNR/MOST froms. Two copies DNR left in the home; encouraged keeping a copy on the fridge and bringing the other form with patient should she travel outside the home. MOST: DNR/DNI, Comfort level of Medical Interventions. Yes to IVFs, Yes to Antibiotics, Yes to Tube feedings. Both forms uploaded  into [facility] EMR B. Goals of Care: To avoid hospitalizations.  2. Cognitive / Functional status:   Overall improvement since 2-day ER observation (4/27-4/29/2021) for stroke. Resolution of L sided facial droop. R UE weakness resolved. Now able to transfer with one person assist, and ambulate with gait belt, rolling walker, and stand by assist. Can ambulate 50 feet; further ambulation limited by fatigue.  R base of toe reddened, swollen, painful. First noted just after hospital discharge. Providers believe toe injured at the time of her fall. Redness and swelling improved over these last 2 weeks.   Takes 1 tab bid to tid 5-325 hydrocodone acetaminophen for hip and, most recently, toe pain  Nocturnal incontinence of urine d/t lack of assistance to toilet. Continent of bowel. Moves bowels 1-2x/wk; prn MiraLax.   -discussed importance of keeping up on bowel movements at least qod setting of opioids  Last measured weight 122.4 lbs April 6th 2021.  Lost 38lbs slowly over previous 53months. At a height of 5'3" her BMI is 21.7 kg/m2. Good appetite of 2  small meals/day. Emesis and nausea few weeks ago thought r/t GERD. Her script for PPI is waiting for pick up at [facility].   Long h/o neuropathy r/t diabetes. Now only occasional tingling in her fingers.   4. Family Supports: Patient moved in with her daughter [name] after recent ER visit. Previously in [place] at the [facility]. Not anticipating going back.  1 son Charlotte Crumb in [place]; nursing home resident for the last 1.5 yrs d/t stroke. Followed by [facility] PT (rep [place]). RW, BSC, transport chair, shower chair. On CAPs services ([facility]) 6 hr/day/ 7 days/wk. Daughter hires additional aide service for 3 hrs q eve. Granddaughter who is a NP assists as able.  5. Follow up Palliative Care Visit: Daughter will call on a prn basis, and for signs of patient decline.  I spent 60 minutes providing this consultation from 10:30am to 11:30am. More than 50% of the time in this consultation was spent coordinating communication.   HISTORY OF PRESENT ILLNESS:  [name] is a 86 y.o. female with history of hypertension, hyperlipidemia, CKD, diabetes, clinically blind L eye, R hip DJD, CVA (08/08/2019). Syncope (10/2011). She lives by herself in an independent living facility.  According to family, about 2 weeks ago patient suddenly started having a decline.  She has been having weakness with movement and activity, particularly they have noted that there is weakness of the right side.  Patient can no longer function on her own.  4/27-4/29/2021: [facility] ER acute ischemic stroke.  Palliative Care was asked to help address goals of care.   CODE STATUS: DNR  PPS: 30%  HOSPICE ELIGIBILITY/DIAGNOSIS: TBD  PAST MEDICAL HISTORY:  Past Medical History:  Diagnosis Date  . Blind    Clinically blind on the left eye  . Degeneration, intervertebral disc    DJD, right hip  . Diabetes mellitus    type 2 since 1974  . Hyperlipidemia   . Hypertension   . Renal disorder    CKD stage III    SOCIAL HX:  Social  History   Tobacco Use  . Smoking status: Never Smoker  . Smokeless tobacco: Never Used  Substance Use Topics  . Alcohol use: No    ALLERGIES:  Allergies  Allergen Reactions  . Other Other (See Comments)    PT is a [place] witness per daughter. NO Blood products.   . Corticosteroids Other (See Comments)    Hearing loss.      PERTINENT MEDICATIONS:  Outpatient Encounter Medications as of 08/28/2019  Medication Sig  . aspirin EC 81 MG tablet Take 1 tablet (81 mg total) by mouth daily.  Marland Kitchen atorvastatin (LIPITOR) 40 MG tablet Take 1 tablet (40 mg total) by mouth daily.  . Cholecalciferol (VITAMIN D PO) Take 1 tablet by mouth every Monday, Wednesday, and Friday.   . furosemide (LASIX) 20 MG tablet Take 20 mg by mouth daily. Taking 3 x/week  . Multiple Vitamins-Minerals (CENTRUM SILVER PO) Take 1 tablet by mouth daily.   Marland Kitchen oxyCODONE-acetaminophen (PERCOCET/ROXICET) 5-325 MG tablet Take 1 tablet by mouth every 6 (six) hours as needed for severe pain. 0.5-1 tab q6h prn  . Polyethylene Glycol 3350 (MIRALAX PO) Take 1 Dose by mouth as needed (constipation).   . prednisoLONE acetate (PRED FORTE) 1 % ophthalmic suspension Place 1 drop into both eyes 2 (two) times daily.  . Insulin Detemir (LEVEMIR FLEXPEN) 100 UNIT/ML SOPN Inject 20 Units into the skin as needed (when sugar is over 200).   . [DISCONTINUED] cephALEXin (KEFLEX) 500 MG capsule Take 500 mg by mouth every 12 (twelve) hours.  . [DISCONTINUED] oxyCODONE-acetaminophen (PERCOCET/ROXICET) 5-325 MG per tablet Take 0.5-1 tablets by mouth every 6 (six) hours as needed for pain. (Patient taking differently: Take 1 tablet by mouth every 6 (six) hours as needed for moderate pain. )   No facility-administered encounter medications on file as of 08/28/2019.    PHYSICAL EXAM:   General: Well nourished, pleasantly conversant. Blind L eye; diminished eyesight R. Oriented x 3. Daughter [name] and hired caregiver in attendance. PE limited to minimize  COVID exposure. Extremities: bilateral LE edema pitting to low ankle. R base of toe reddened, swollen, slightly warm.  Skin: no rashes Neurological: Weakness but otherwise nonfocal  Julianne Handler, NP

## 2019-08-28 ENCOUNTER — Other Ambulatory Visit: Payer: Medicare Other | Admitting: Internal Medicine

## 2019-08-28 ENCOUNTER — Other Ambulatory Visit: Payer: Self-pay

## 2019-08-28 ENCOUNTER — Encounter: Payer: Self-pay | Admitting: Internal Medicine

## 2019-08-28 DIAGNOSIS — Z515 Encounter for palliative care: Secondary | ICD-10-CM

## 2019-08-28 DIAGNOSIS — Z7189 Other specified counseling: Secondary | ICD-10-CM

## 2019-09-04 ENCOUNTER — Other Ambulatory Visit: Payer: Medicare Other | Admitting: Internal Medicine

## 2019-12-21 ENCOUNTER — Telehealth: Payer: Self-pay | Admitting: Nurse Practitioner

## 2019-12-22 ENCOUNTER — Other Ambulatory Visit: Payer: Medicare Other | Admitting: Nurse Practitioner

## 2019-12-22 ENCOUNTER — Other Ambulatory Visit: Payer: Self-pay

## 2019-12-22 ENCOUNTER — Encounter: Payer: Self-pay | Admitting: Nurse Practitioner

## 2019-12-22 DIAGNOSIS — Z515 Encounter for palliative care: Secondary | ICD-10-CM

## 2019-12-22 DIAGNOSIS — R52 Pain, unspecified: Secondary | ICD-10-CM

## 2019-12-22 NOTE — Progress Notes (Signed)
[facility] Palliative Care Consult Note Telephone: [phone]  Fax: [phone]  PATIENT NAME: Carol Poole [address] [place] [address] [phone] (home)  DOB: [birth year] MRN: [id number]  PRIMARY CARE PROVIDER:    Jonathon Jordan, MD,  [place] [address] [phone]  REFERRING PROVIDER:   Jonathon Jordan, MD [place] [address],  [place] [address] [phone]  RESPONSIBLE PARTY:   Extended Emergency Contact Information Primary Emergency Contact: Poole,Carol  [place] Phone: [phone] Relation: Daughter  I met face to face with patient and family in home.  ASSESSMENT AND RECOMMENDATIONS:   1. Advance Care Planning/Goals of Care:  A. Directives: DNR and MOST form in home and on [facility] EMR. Current MOST form details : DNR/DNI, Comfort level of Medical Interventions. Yes to IVFs, Yes to Antibiotics, Yes to Tube feedings. Family wants to update form to comfort measures only.  B. Goals of Care: Comfort and avoid hospitalizations. No invasive treatments.  2. Cognitive / Functional status: Patient currently bed bound, requires total assistance with all ADLs, needs assistance with feeding but able to self drink from cup. 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 She is however oriented x 4 and coherent. Family report she sleeps more than before.  3. Symptoms Management. Daughter Carol Poole called on 12/21/2019 reporting patient not doing well, reporting acute decline in function, now completely bed ridden, not eating, having swelling in extremities, confusion- stripping of clothing during night time, c/o of pain in bilateral flank. Discussed need for workup for UTI/other infection, daughter does not want work-up or treatment at this time. Daughter expressed desire for comfort measure as she does not want mother to leave her house. Daughter report patient  having prescription for percocet. Advised daughter over the phone to administer one tablet of Percocet crushed and placed in a teaspoon of apple sauce by mouth at bedtime.  Had a visit with patient today. Carol reported patient slept through the night after the administration of one tab Percocet 5-349m. Patient report pain of 8/10. Discussed needed to schedule pain med for comfort. Recommended administering one tab Percocet 5-3233mevery 6hrs scheduled. Constipation: Carol report patient last BM was 3 days ago, patient on Colace stool softener. Patient has +ve bowel sounds on physical exam. Recommended starting patient on over the counter Miralax 17g mixed in 4-8oz of liquid, give every evening, advised to hold for diarrhea.  4. Family Supports: Patient moved in with her daughter MaBurman Nievesfter having CVA in April 2021. Previously [facility]/ 7 days/wk. Daughter hires additional aide service for 3 hrs q evening. Granddaughter who is a NP assists as able.  5. Follow up Palliative Care Visit: Palliative care will continue to provide support to patient, family and the medical team.  Return in 4 weeks or prn.  I spent 30 minutes providing this consultation, time includes time spent with patient and family, chart review, provider coordination, and documentation. More than 50% of the time in this consultation was spent coordinating communication.   HISTORY OF PRESENT ILLNESS:  Carol Poole a [age].o. year old female with multiple medical problems including hypertension, hyperlipidemia, CKD, diabetes, clinically blind L eye, hard of hearing, R hip DJD, CVA (08/08/2019), Syncope (10/2011). Palliative Care was asked to follow this patient by consultation request of WoJonathon JordanMD to help address advance  care planning and goals of care. This is a follow up visit.  CODE STATUS: DNR  PPS: 20%  HOSPICE  ELIGIBILITY/DIAGNOSIS: Patient deemed eligible for Hospice care services. Family and patient in agreement with plan. Pending referral from PCP.  PAST MEDICAL HISTORY:  Past Medical History:  Diagnosis Date  . Blind    Clinically blind on the left eye  . Degeneration, intervertebral disc    DJD, right hip  . Diabetes mellitus    type 2 since 1974  . Hyperlipidemia   . Hypertension   . Renal disorder    CKD stage III    SOCIAL HX:  Social History   Tobacco Use  . Smoking status: Never Smoker  . Smokeless tobacco: Never Used  Substance Use Topics  . Alcohol use: No   FAMILY HX:  Family History  Problem Relation Age of Onset  . Hypertension Mother   . Hypertension Father     ALLERGIES:  Allergies  Allergen Reactions  . Other Other (See Comments)    PT is a [place] witness per daughter. NO Blood products.   . Corticosteroids Other (See Comments)    Hearing loss.      PERTINENT MEDICATIONS:  Outpatient Encounter Medications as of 12/22/2019  Medication Sig  . atorvastatin (LIPITOR) 40 MG tablet Take 1 tablet (40 mg total) by mouth daily.  . Cholecalciferol (VITAMIN D PO) Take 1 tablet by mouth every Monday, Wednesday, and Friday.   . furosemide (LASIX) 20 MG tablet Take 20 mg by mouth daily. Taking 3 x/week  . Insulin Detemir (LEVEMIR FLEXPEN) 100 UNIT/ML SOPN Inject 20 Units into the skin as needed (when sugar is over 200).   . Multiple Vitamins-Minerals (CENTRUM SILVER PO) Take 1 tablet by mouth daily.   Marland Kitchen oxyCODONE-acetaminophen (PERCOCET/ROXICET) 5-325 MG tablet Take 1 tablet by mouth every 6 (six) hours as needed for severe pain. 0.5-1 tab q6h prn  . Polyethylene Glycol 3350 (MIRALAX PO) Take 1 Dose by mouth as needed (constipation).   . prednisoLONE acetate (PRED FORTE) 1 % ophthalmic suspension Place 1 drop into both eyes 2 (two) times daily.   No facility-administered encounter medications on file as of 12/22/2019.    PHYSICAL EXAM / ROS:   Current and past  weights: Family report weight loss, no recent weight, last weight 5 months ago 122.4lbs down from baseline weights of 200s. Ht 54f" General: NAD, ill and frail appearing, coherent. Lying in bed Cardiovascular: denied chest pain, +1 edema, S1S2 normal Pulmonary: no cough, no increased SOB, room air Abdomen: appetite poor, endorses constipation, incontinent of bowel GU: denies dysuria, incontinent of urine MSK:  no joint and ROM abnormalities, non-ambulatory Skin: no rashes or wounds reported Neurological: Weakness, R>L   QJari Favre DNP, AGPCNP-BC

## 2020-02-12 DEATH — deceased

## 2020-05-14 ENCOUNTER — Telehealth: Payer: Self-pay

## 2020-05-14 NOTE — Telephone Encounter (Signed)
Error document

## 2021-03-08 IMAGING — CR DG CHEST 2V
2 series · 2 of 2 positions shown · non-contrast
Comparison: April 28, 2013

CLINICAL DATA: Weakness

EXAM:
CHEST - 2 VIEW

[w chest lat]
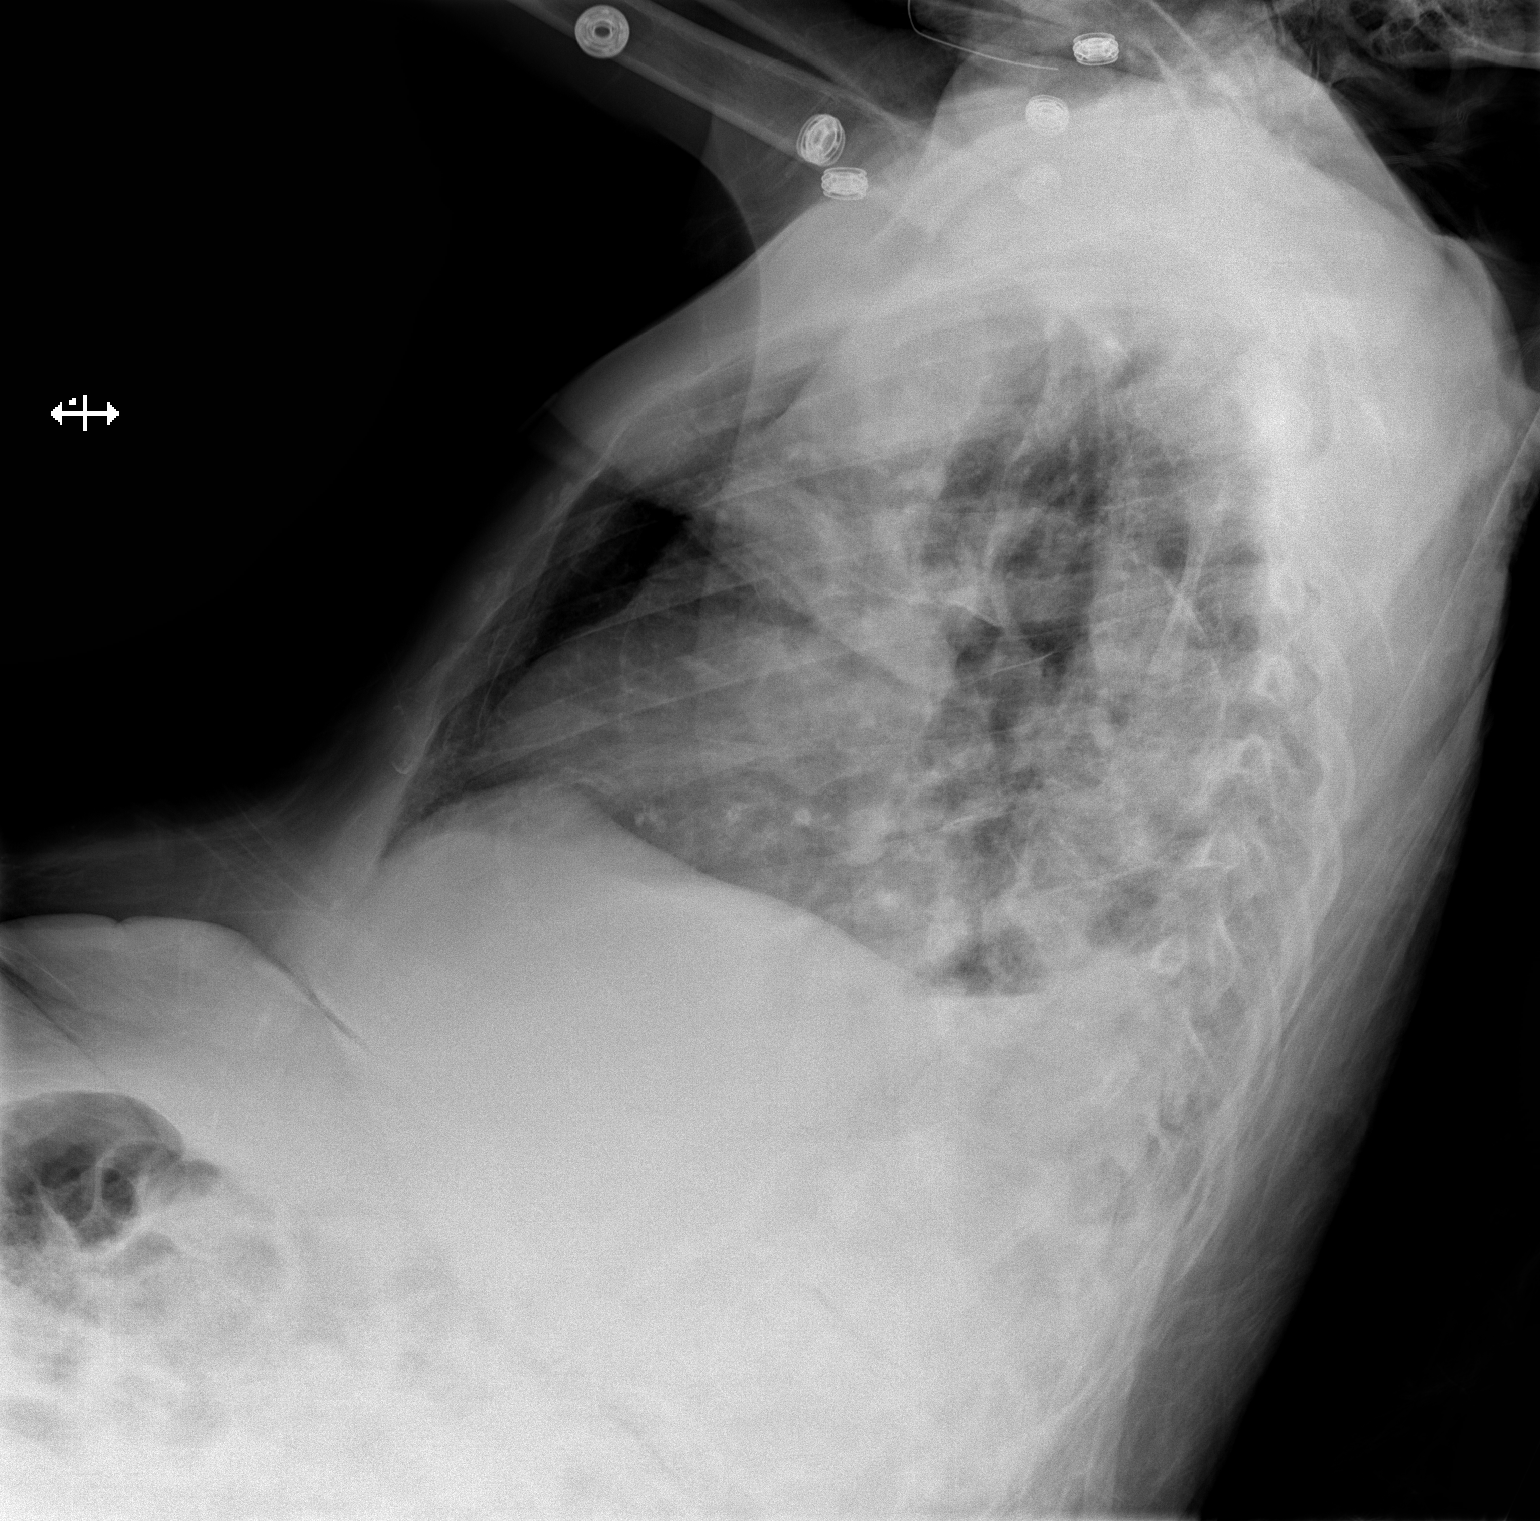

[x chest ap]
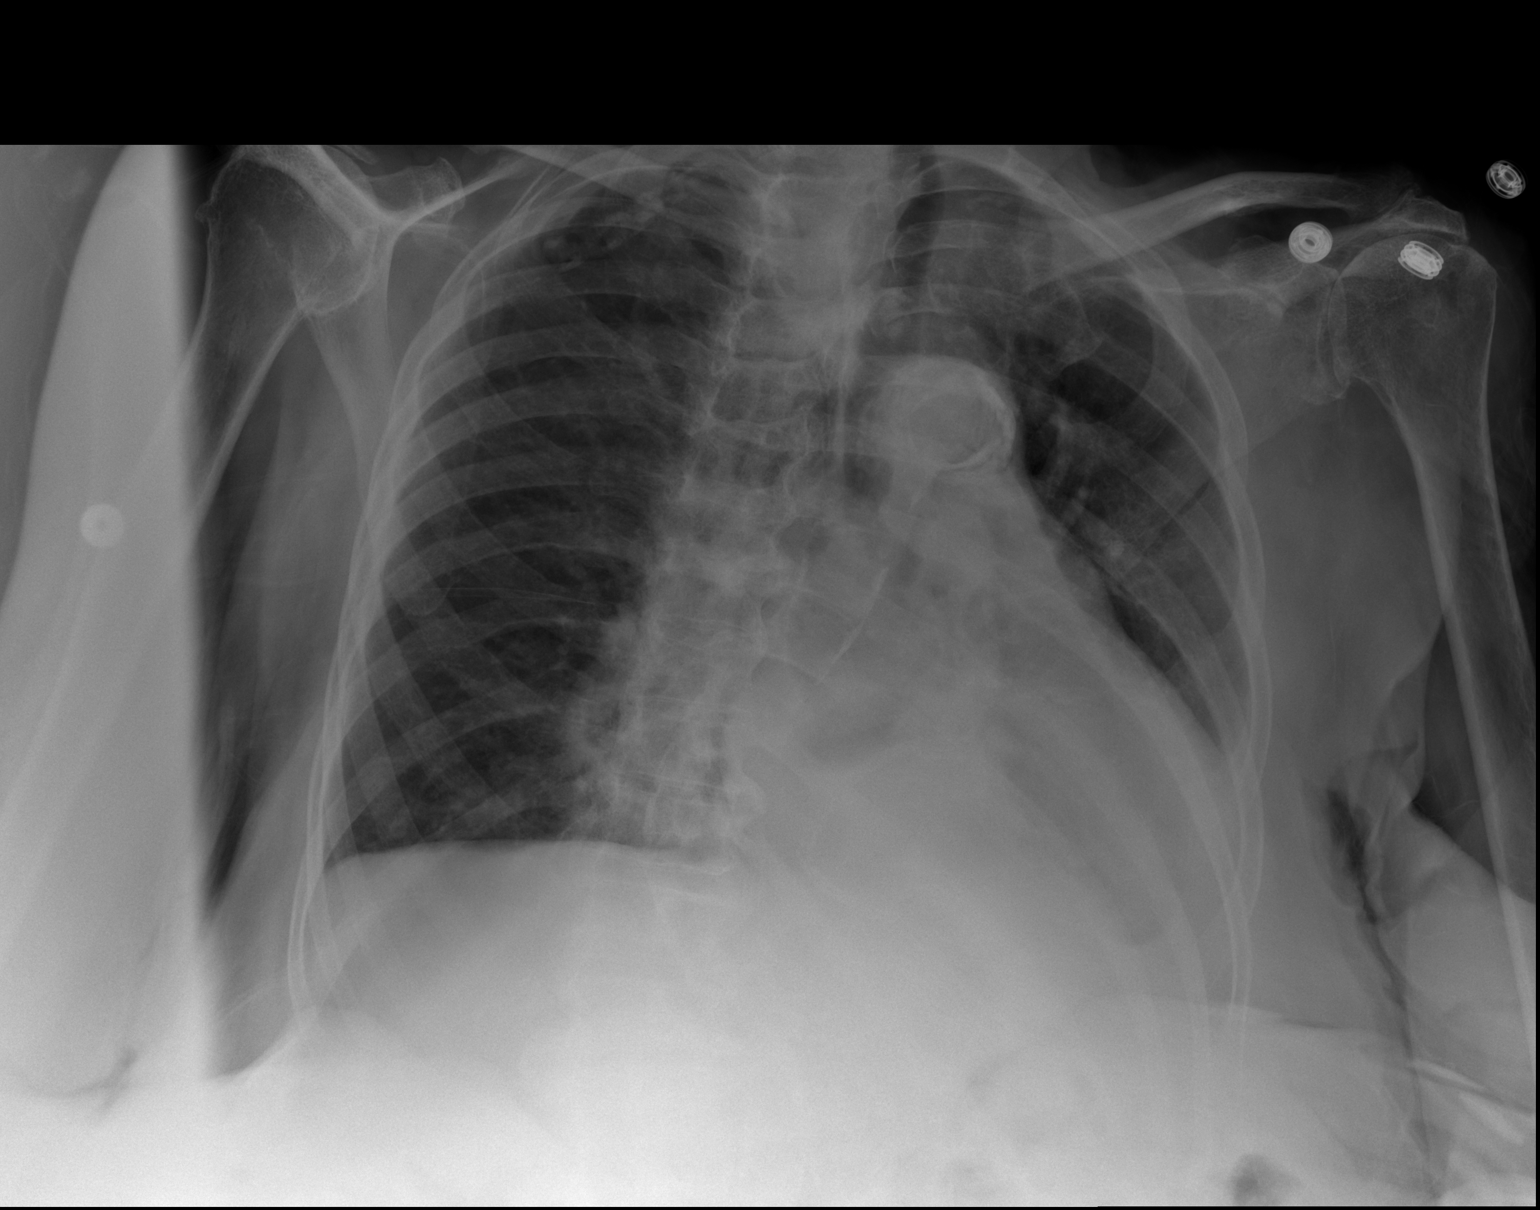

[2 of 2 positions shown; findings below may reference images not displayed]

FINDINGS: There is airspace consolidation in the left lower lobe with small
left pleural effusion. Lungs elsewhere are clear. Heart size and
pulmonary vascular normal. There is a hiatal hernia. There is aortic
atherosclerosis as well as calcification in the right subclavian
artery. There is degenerative change in each shoulder.
IMPRESSION: Airspace consolidation left lower lobe, likely due to pneumonia,
with small left pleural effusion. Lungs elsewhere clear. Heart size
normal. Hiatal hernia present. Aortic Atherosclerosis ([id number]-EW8.8).

## 2021-03-08 IMAGING — CT CT HEAD W/O CM
3 series · 16 of 47 positions shown, 19 images · non-contrast
Comparison: 11/11/2011

CLINICAL DATA: Weakness and gait disturbance developing earlier
this month. Right-sided weakness. Left facial droop. Slurred speech
beginning 3 days ago.

EXAM:
CT HEAD WITHOUT CONTRAST
TECHNIQUE: Contiguous axial images were obtained from the base of the skull
through the vertex without intravenous contrast.

[Series 2: head wo · axial · 0.43mm/px · z∈[-98,+27]mm · 10 of 31 slices shown, 13 images]
[im 3/31  brain]
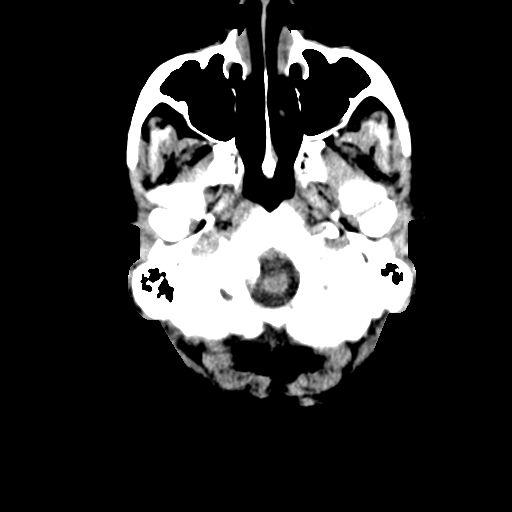
[im 3/31  bone]
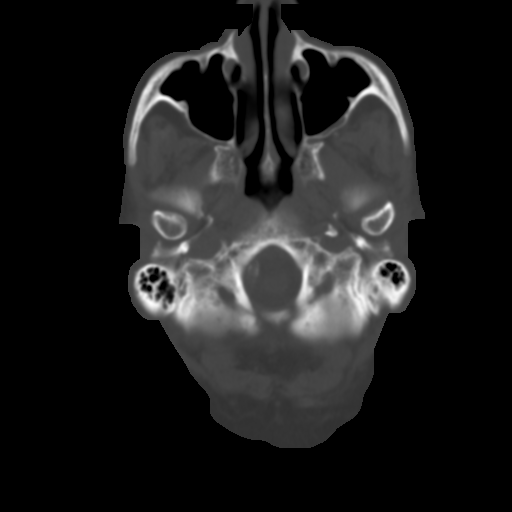
[im 6/31  brain]
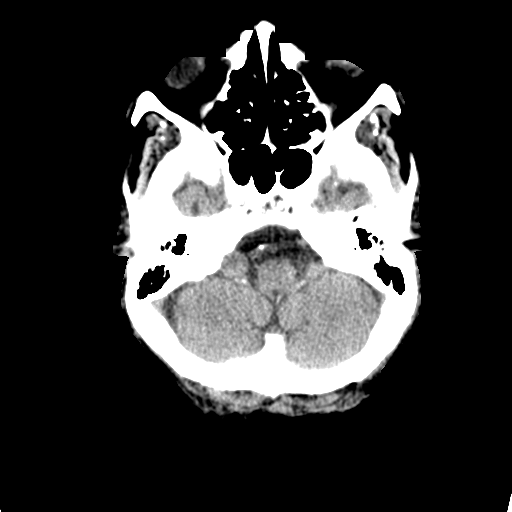
[im 9/31  brain]
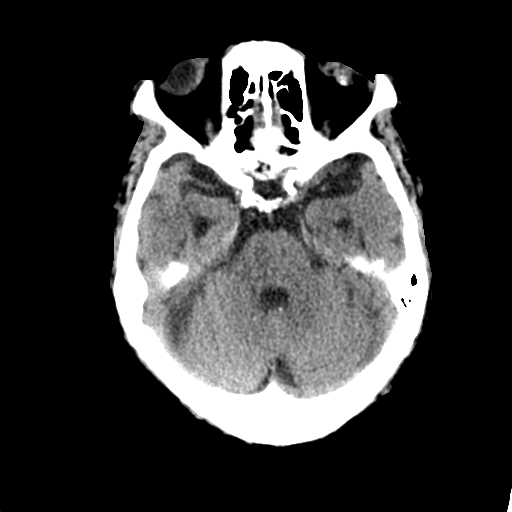
[im 11/31  brain]
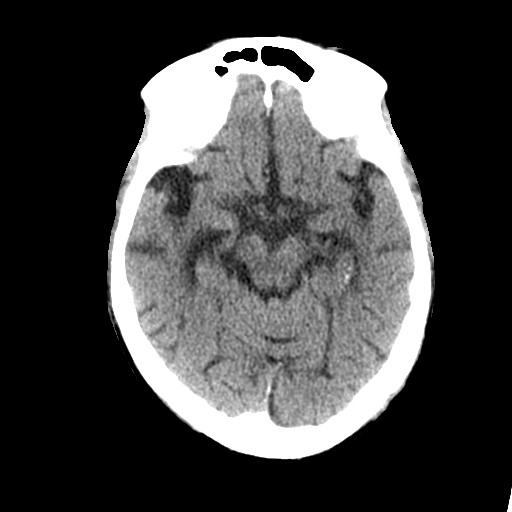
[im 14/31  brain]
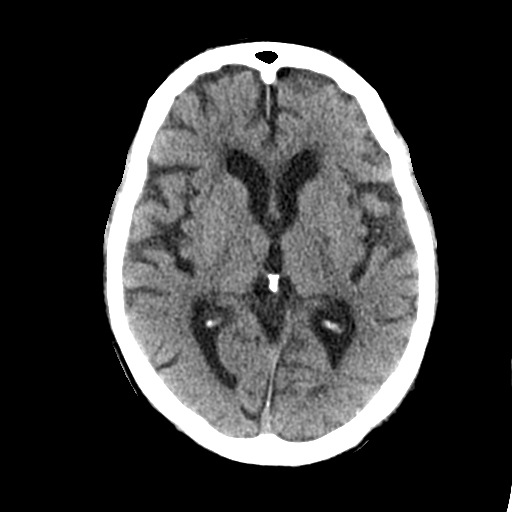
[im 14/31  bone]
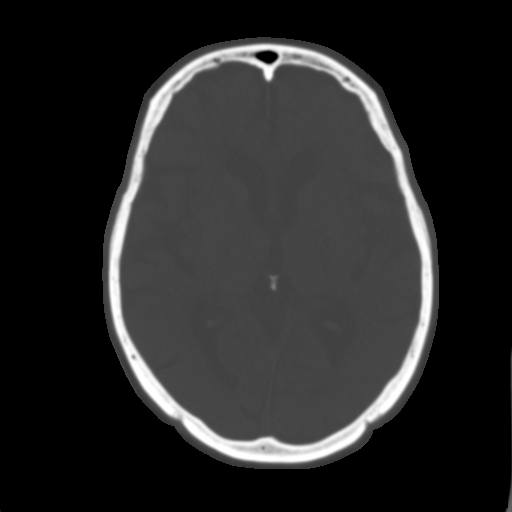
[im 17/31  brain]
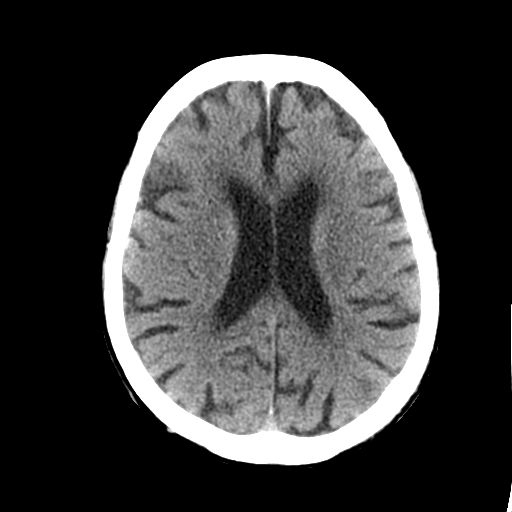
[im 20/31  brain]
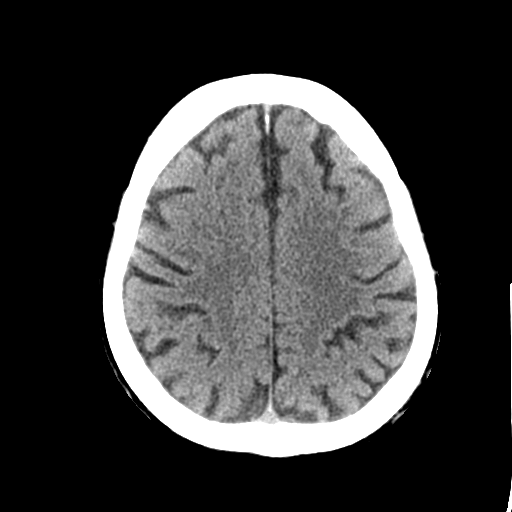
[im 23/31  brain]
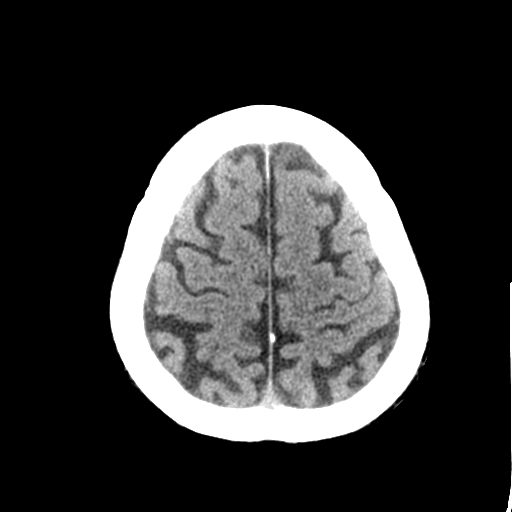
[im 25/31  brain]
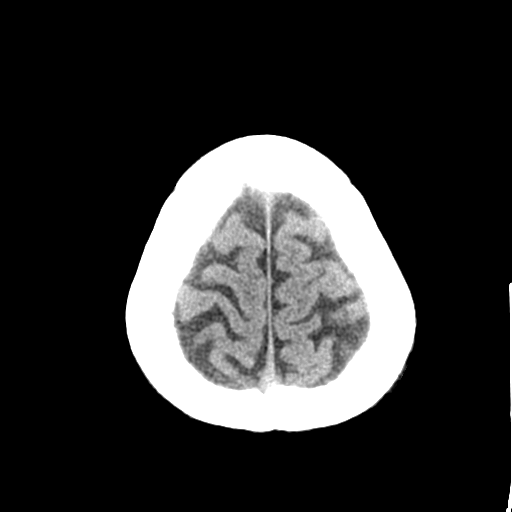
[im 25/31  bone]
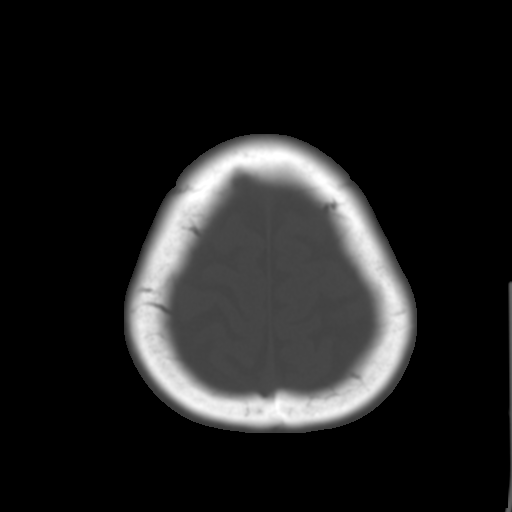
[im 28/31  brain]
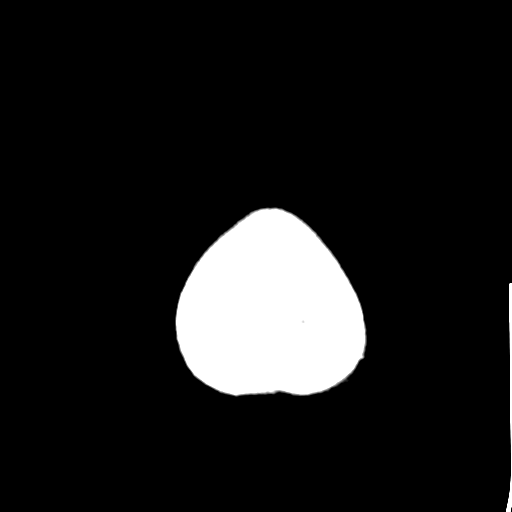

[Series 4: coronal soft tissue · coronal · 0.32mm/px · 3 of 66 slices shown]
[im 22/66  brain]
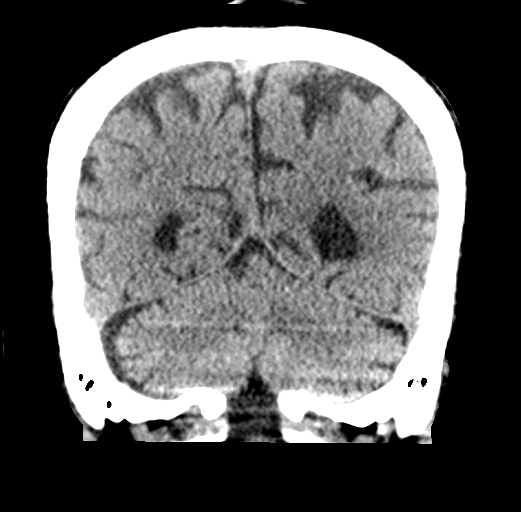
[im 29/66  brain]
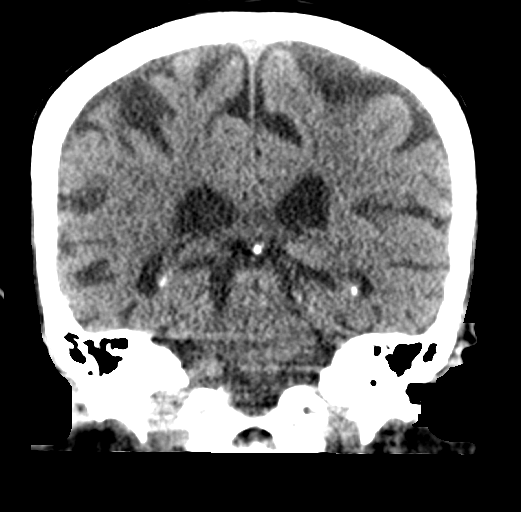
[im 37/66  brain]
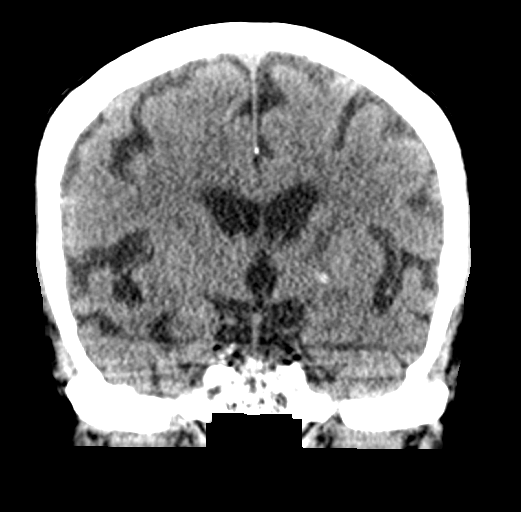

[Series 5: sagittal soft tissue · sagittal · 0.33mm/px · 3 of 55 slices shown]
[im 19/55  brain]
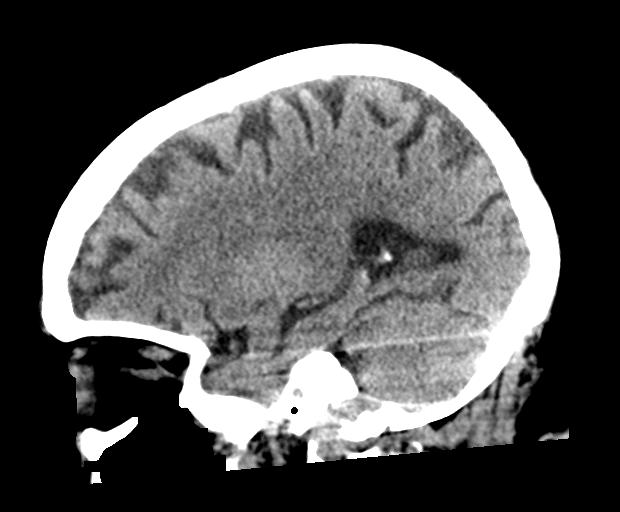
[im 28/55  brain]
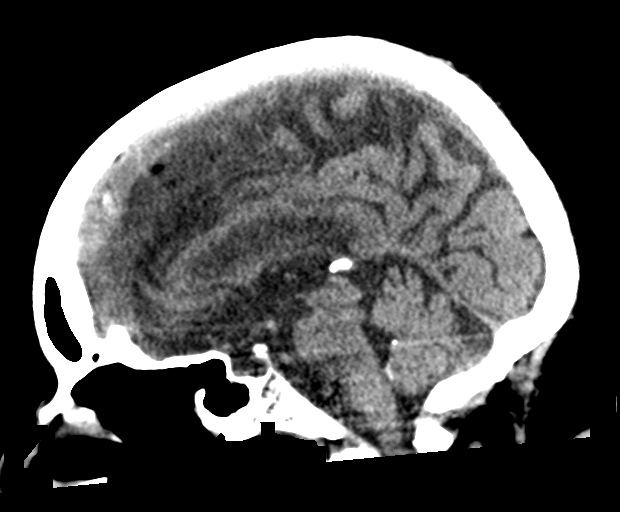
[im 37/55  brain]
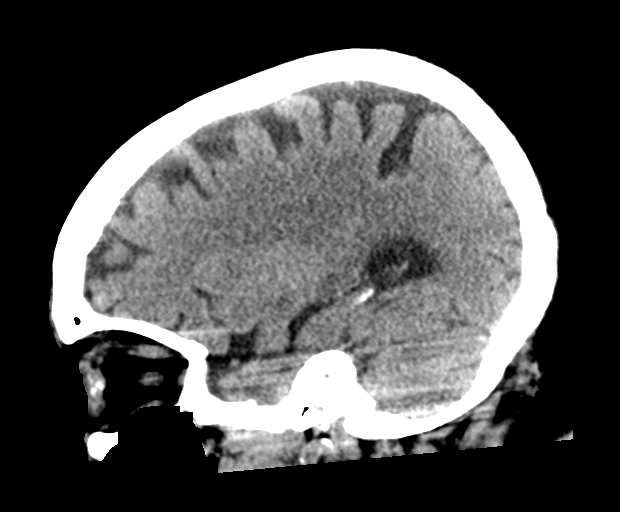

[16 of 47 positions shown; findings below may reference images not displayed]

FINDINGS: Brain: Brainstem and cerebellum are unremarkable. There is probably
an acute to subacute stroke in the left basal ganglia/posterior limb
internal capsule. Old small vessel infarction in the radiating white
matter tracts above that. Mild chronic small-vessel change of the
cerebral hemispheric white matter. No large vessel territory
infarction. No mass lesion, hemorrhage, hydrocephalus or extra-axial
collection.

Vascular: There is atherosclerotic calcification of the major
vessels at the base of the brain.

Skull: Negative

Sinuses/Orbits: Clear/normal

Other: None
IMPRESSION: Acute to subacute infarction in the left basal ganglia/posterior
limb internal capsule. Older small vessel infarction in the
radiating white matter tracts just above that. No hemorrhage or mass
effect.
